# Patient Record
Sex: Female | Born: 1990 | Race: Black or African American | Hispanic: No | Marital: Single | Smoking: Former smoker
Health system: Southern US, Community
[De-identification: ages and names within clinical notes are randomized; demographics above are authoritative.]

## PROBLEM LIST (undated history)

## (undated) DIAGNOSIS — O139 Gestational [pregnancy-induced] hypertension without significant proteinuria, unspecified trimester: Secondary | ICD-10-CM

## (undated) DIAGNOSIS — R8271 Bacteriuria: Secondary | ICD-10-CM

## (undated) DIAGNOSIS — Z789 Other specified health status: Secondary | ICD-10-CM

## (undated) DIAGNOSIS — I10 Essential (primary) hypertension: Secondary | ICD-10-CM

## (undated) HISTORY — DX: Other specified health status: Z78.9

## (undated) HISTORY — PX: NO PAST SURGERIES: SHX2092

## (undated) HISTORY — DX: Bacteriuria: R82.71

---

## 2017-06-10 NOTE — L&D Delivery Note (Signed)
Delivery Note  First Stage: Labor onset: 0237 Augmentation: Cytotec Analgesia /Anesthesia intrapartum: epidural SROM at 0235  Second Stage: Complete dilation at 0842 Onset of pushing at 0850 FHR second stage 150  Delivery of a viable female at 68 by CNM in OA position Loose nuchal cord x 1 Cord double clamped after cessation of pulsation, cut by CNM Cord blood sample collected   Arterial cord blood sample collected = 7.285  Third Stage: Placenta delivered via Tomasa Blase intact with 3 VC @ 0900 Placenta disposition: pathology Uterine tone firm / bleeding small  1st degree vaginal laceration identified // 4 cm x 8 cm Bartholin's abscess on RT side; unable to I&D -- will start on Bactrim DS 1 tab BID x 7 days Anesthesia for repair: epidural Repair 2-0 vicryl rapide Est. Blood Loss (mL): 150  Complications: NICU team called at delivery, neonate attempting to take breaths, no respirations noted, NRP steps taken by Colin Mulders, RN and Davina Poke, RN -- care of neonate taken over upon arrival to the room; MGM and FOB at bassinet side // mother thrashing, crying and vomiting during collection of cord gases, only 0.5 mL collected to avoid needle stick of CNM and patient  Mom to postpartum.  Baby to NICU.  Newborn: Birth Weight: 5 lbs 10.7 oz  Apgar Scores: 1/4/7 per NICU team Feeding planned: breast & formula  Raelyn Mora  MSN, CNM 10/31/2017, 10:20 AM

## 2017-10-16 ENCOUNTER — Other Ambulatory Visit: Payer: Self-pay

## 2017-10-16 ENCOUNTER — Encounter (HOSPITAL_COMMUNITY): Payer: Self-pay | Admitting: *Deleted

## 2017-10-16 ENCOUNTER — Inpatient Hospital Stay (HOSPITAL_COMMUNITY)
Admission: AD | Admit: 2017-10-16 | Discharge: 2017-10-16 | Disposition: A | Discharge status: Home / Self Care | Payer: Medicaid Other | Source: Ambulatory Visit | Attending: Obstetrics & Gynecology | Admitting: Obstetrics & Gynecology

## 2017-10-16 ENCOUNTER — Telehealth: Payer: Self-pay | Admitting: Advanced Practice Midwife

## 2017-10-16 DIAGNOSIS — R109 Unspecified abdominal pain: Secondary | ICD-10-CM | POA: Diagnosis not present

## 2017-10-16 DIAGNOSIS — O0933 Supervision of pregnancy with insufficient antenatal care, third trimester: Secondary | ICD-10-CM

## 2017-10-16 DIAGNOSIS — Z3A37 37 weeks gestation of pregnancy: Secondary | ICD-10-CM | POA: Diagnosis not present

## 2017-10-16 DIAGNOSIS — Z880 Allergy status to penicillin: Secondary | ICD-10-CM | POA: Diagnosis not present

## 2017-10-16 DIAGNOSIS — F17210 Nicotine dependence, cigarettes, uncomplicated: Secondary | ICD-10-CM | POA: Insufficient documentation

## 2017-10-16 DIAGNOSIS — O99333 Smoking (tobacco) complicating pregnancy, third trimester: Secondary | ICD-10-CM | POA: Insufficient documentation

## 2017-10-16 DIAGNOSIS — O26893 Other specified pregnancy related conditions, third trimester: Secondary | ICD-10-CM | POA: Insufficient documentation

## 2017-10-16 DIAGNOSIS — O26843 Uterine size-date discrepancy, third trimester: Secondary | ICD-10-CM

## 2017-10-16 DIAGNOSIS — Z3A00 Weeks of gestation of pregnancy not specified: Secondary | ICD-10-CM | POA: Diagnosis not present

## 2017-10-16 LAB — DIFFERENTIAL
BASOS PCT: 0 %
Basophils Absolute: 0 10*3/uL (ref 0.0–0.1)
EOS ABS: 0.1 10*3/uL (ref 0.0–0.7)
EOS PCT: 2 %
Lymphocytes Relative: 28 %
Lymphs Abs: 2.2 10*3/uL (ref 0.7–4.0)
MONO ABS: 0.4 10*3/uL (ref 0.1–1.0)
Monocytes Relative: 5 %
Neutro Abs: 5.1 10*3/uL (ref 1.7–7.7)
Neutrophils Relative %: 65 %

## 2017-10-16 LAB — TYPE AND SCREEN
ABO/RH(D): O POS
Antibody Screen: NEGATIVE

## 2017-10-16 LAB — COMPREHENSIVE METABOLIC PANEL
ALK PHOS: 881 U/L — AB (ref 38–126)
ALT: 14 U/L (ref 14–54)
ANION GAP: 12 (ref 5–15)
AST: 20 U/L (ref 15–41)
Albumin: 2.8 g/dL — ABNORMAL LOW (ref 3.5–5.0)
BILIRUBIN TOTAL: 0.6 mg/dL (ref 0.3–1.2)
BUN: 8 mg/dL (ref 6–20)
CALCIUM: 8.5 mg/dL — AB (ref 8.9–10.3)
CO2: 22 mmol/L (ref 22–32)
Chloride: 101 mmol/L (ref 101–111)
Creatinine, Ser: 0.63 mg/dL (ref 0.44–1.00)
GFR calc non Af Amer: >60 mL/min (ref >60)
Glucose, Bld: 109 mg/dL — ABNORMAL HIGH (ref 65–99)
Potassium: 3.4 mmol/L — ABNORMAL LOW (ref 3.5–5.1)
SODIUM: 135 mmol/L (ref 135–145)
TOTAL PROTEIN: 6.5 g/dL (ref 6.5–8.1)

## 2017-10-16 LAB — CBC
HCT: 35.6 % — ABNORMAL LOW (ref 36.0–46.0)
HEMOGLOBIN: 11.9 g/dL — AB (ref 12.0–15.0)
MCH: 28.7 pg (ref 26.0–34.0)
MCHC: 33.4 g/dL (ref 30.0–36.0)
MCV: 86 fL (ref 78.0–100.0)
Platelets: 259 10*3/uL (ref 150–400)
RBC: 4.14 MIL/uL (ref 3.87–5.11)
RDW: 12.8 % (ref 11.5–15.5)
WBC: 7.8 10*3/uL (ref 4.0–10.5)

## 2017-10-16 LAB — RAPID URINE DRUG SCREEN, HOSP PERFORMED
AMPHETAMINES: NOT DETECTED
BARBITURATES: NOT DETECTED
BENZODIAZEPINES: NOT DETECTED
Cocaine: NOT DETECTED
Opiates: NOT DETECTED
Tetrahydrocannabinol: POSITIVE — AB

## 2017-10-16 LAB — ABO/RH: ABO/RH(D): O POS

## 2017-10-16 LAB — URINALYSIS, ROUTINE W REFLEX MICROSCOPIC
Bilirubin Urine: NEGATIVE
GLUCOSE, UA: NEGATIVE mg/dL
KETONES UR: 20 mg/dL — AB
NITRITE: POSITIVE — AB
PH: 7 (ref 5.0–8.0)
Protein, ur: 30 mg/dL — AB
SPECIFIC GRAVITY, URINE: 1.008 (ref 1.005–1.030)

## 2017-10-16 LAB — OB RESULTS CONSOLE GBS: STREP GROUP B AG: NEGATIVE

## 2017-10-16 LAB — OB RESULTS CONSOLE GC/CHLAMYDIA: GC PROBE AMP, GENITAL: NEGATIVE

## 2017-10-16 LAB — PROTEIN / CREATININE RATIO, URINE
CREATININE, URINE: 114 mg/dL
PROTEIN CREATININE RATIO: 0.36 mg/mg{creat} — AB (ref 0.00–0.15)
TOTAL PROTEIN, URINE: 41 mg/dL

## 2017-10-16 LAB — HEPATITIS B SURFACE ANTIGEN: Hepatitis B Surface Ag: NEGATIVE

## 2017-10-16 MED ORDER — NITROFURANTOIN MONOHYD MACRO 100 MG PO CAPS: 100.0000 mg | ORAL_CAPSULE | Freq: Two times a day (BID) | ORAL | 0 refills | Status: DC

## 2017-10-16 NOTE — Telephone Encounter (Signed)
Reviewed abnormal protein creatinine ratio with Dr Macon Large. Pt still with unsure dates, outpatient [place] scheduled next week.  Plan to recheck BP tomorrow.  Pt new OB scheduled tomorrow at 1:30 pm.  Also, UTI noted in pt urine sample today in MAU. Called pt to notify her of Macrobid 100 mg BID x 7 days (allergy to PCN/cephalosporins) and of prenatal appointment tomorrow.  Pt phone number is not a working number. Called pt emergency contact number ,her mother, and phone rang then busy signal so unable to leave a message.

## 2017-10-16 NOTE — Progress Notes (Signed)
Ambulates off unit with mother at side and discharge papers in hand

## 2017-10-16 NOTE — MAU Note (Signed)
States contractions over the past week. Currently no pain

## 2017-10-16 NOTE — Discharge Instructions (Signed)

## 2017-10-16 NOTE — MAU Provider Note (Addendum)
Chief Complaint:  Abdominal Pain   First Provider Initiated Contact with Patient 10/16/17 1036      HPI: Diane Ramirez is a 27 y.o. G1P0 at 19w1dwho presents to maternity admissions reporting she has not had prenatal care and is worried, and she had cramping off and on this week.  She denies pain today. There is no vaginal bleeding or leaking of fluid.  The baby is moving well. She reports she was initially in denial about the pregnancy, and then just made a mistake to wait so long to start care. She has no previous medical problems.  She reports a sure LMP and regular periods.  There are no other symptoms.  She has not tried any treatments.    HPI  Past Medical History: History reviewed. No pertinent past medical history.  Past obstetric history: OB History  Gravida Para Term Preterm AB Living  1            SAB TAB Ectopic Multiple Live Births               # Outcome Date GA Lbr Len/2nd Weight Sex Delivery Anes PTL Lv  1 Current             Past Surgical History: History reviewed. No pertinent surgical history.  Family History: No family history on file.  Social History: Social History   Tobacco Use  . Smoking status: Current Every Day Smoker    Packs/day: 0.25    Years: 1.00    Pack years: 0.25    Types: Cigars  . Smokeless tobacco: Never Used  Substance Use Topics  . Alcohol use: Not Currently  . Drug use: Never    Allergies:  Allergies  Allergen Reactions  . Penicillins Swelling    Has patient had a PCN reaction causing immediate rash, facial/tongue/throat swelling, SOB or lightheadedness with hypotension: yes Has patient had a PCN reaction causing severe rash involving mucus membranes or skin necrosis: no Has patient had a PCN reaction that required hospitalization:no Has patient had a PCN reaction occurring within the last 10 years: {no If all of the above answers are "NO", then may proceed with Cephalosporin use.     Meds:  No medications prior to  admission.    ROS:  Review of Systems  Constitutional: Negative for chills, fatigue and fever.  Eyes: Negative for visual disturbance.  Respiratory: Negative for shortness of breath.   Cardiovascular: Negative for chest pain.  Gastrointestinal: Negative for abdominal pain, nausea and vomiting.  Genitourinary: Negative for difficulty urinating, dysuria, flank pain, pelvic pain, vaginal bleeding, vaginal discharge and vaginal pain.  Neurological: Negative for dizziness and headaches.  Psychiatric/Behavioral: Negative.      I have reviewed patient's Past Medical Hx, Surgical Hx, Family Hx, Social Hx, medications and allergies.   Physical Exam   Patient Vitals for the past 24 hrs:  BP Temp Temp src Pulse Resp SpO2 Height Weight  10/16/17 1140 130/84 98.6 F (37 C) Oral 87 16 99 % - -  10/16/17 1030 130/83 - - 90 - - - -  10/16/17 1015 120/76 - - (!) 107 - - - -  10/16/17 1000 121/80 - - (!) 101 - - - -  10/16/17 0949 135/72 - - (!) 102 - - - -  10/16/17 0916 121/90 98.7 F (37.1 C) Oral (!) 120 16 100 % 5' 4.5" (1.638 m) 157 lb (71.2 kg)   Constitutional: Well-developed, well-nourished female in no acute distress.  Cardiovascular: normal rate  Respiratory: normal effort GI: Abd soft, non-tender, gravid appropriate for gestational age.  MS: Extremities nontender, no edema, normal ROM Neurologic: Alert and oriented x 4.  GU: Neg CVAT.  PELVIC EXAM: Cervix pink, visually closed, without lesion, scant white creamy discharge, vaginal walls and external genitalia normal Bimanual exam: Cervix 0/long/high, firm, anterior, neg CMT, uterus nontender, nonenlarged, adnexa without tenderness, enlargement, or mass    FHT:  Baseline  125, moderate variability, accelerations present, no decelerations Contractions: None on toco or to palpation   Labs: Results for orders placed or performed during the hospital encounter of 10/16/17 (from the past 24 hour(s))  Urinalysis, Routine w reflex  microscopic     Status: Abnormal   Collection Time: 10/16/17  9:18 AM  Result Value Ref Range   Color, Urine YELLOW YELLOW   APPearance CLOUDY (A) CLEAR   Specific Gravity, Urine 1.008 1.005 - 1.030   pH 7.0 5.0 - 8.0   Glucose, UA NEGATIVE NEGATIVE mg/dL   Hgb urine dipstick SMALL (A) NEGATIVE   Bilirubin Urine NEGATIVE NEGATIVE   Ketones, ur 20 (A) NEGATIVE mg/dL   Protein, ur 30 (A) NEGATIVE mg/dL   Nitrite POSITIVE (A) NEGATIVE   Leukocytes, UA LARGE (A) NEGATIVE   RBC / HPF 0-5 0 - 5 RBC/hpf   WBC, UA >50 (H) 0 - 5 WBC/hpf   Bacteria, UA MANY (A) NONE SEEN   Squamous Epithelial / LPF 0-5 0 - 5   WBC Clumps PRESENT    Mucus PRESENT   Protein / creatinine ratio, urine     Status: Abnormal   Collection Time: 10/16/17  9:18 AM  Result Value Ref Range   Creatinine, Urine 114.00 mg/dL   Total Protein, Urine 41 mg/dL   Protein Creatinine Ratio 0.36 (H) 0.00 - 0.15 mg/mg[Cre]  Urine rapid drug screen (hosp performed)     Status: Abnormal   Collection Time: 10/16/17  9:18 AM  Result Value Ref Range   Opiates NONE DETECTED NONE DETECTED   Cocaine NONE DETECTED NONE DETECTED   Benzodiazepines NONE DETECTED NONE DETECTED   Amphetamines NONE DETECTED NONE DETECTED   Tetrahydrocannabinol POSITIVE (A) NONE DETECTED   Barbiturates NONE DETECTED NONE DETECTED  CBC     Status: Abnormal   Collection Time: 10/16/17  9:53 AM  Result Value Ref Range   WBC 7.8 4.0 - 10.5 K/uL   RBC 4.14 3.87 - 5.11 MIL/uL   Hemoglobin 11.9 (L) 12.0 - 15.0 g/dL   HCT 16.1 (L) 09.6 - 04.5 %   MCV 86.0 78.0 - 100.0 fL   MCH 28.7 26.0 - 34.0 pg   MCHC 33.4 30.0 - 36.0 g/dL   RDW 40.9 81.1 - 91.4 %   Platelets 259 150 - 400 K/uL  Comprehensive metabolic panel     Status: Abnormal   Collection Time: 10/16/17  9:53 AM  Result Value Ref Range   Sodium 135 135 - 145 mmol/L   Potassium 3.4 (L) 3.5 - 5.1 mmol/L   Chloride 101 101 - 111 mmol/L   CO2 22 22 - 32 mmol/L   Glucose, Bld 109 (H) 65 - 99 mg/dL    BUN 8 6 - 20 mg/dL   Creatinine, Ser 7.82 0.44 - 1.00 mg/dL   Calcium 8.5 (L) 8.9 - 10.3 mg/dL   Total Protein 6.5 6.5 - 8.1 g/dL   Albumin 2.8 (L) 3.5 - 5.0 g/dL   AST 20 15 - 41 U/L   ALT 14 14 - 54 U/L  Alkaline Phosphatase 881 (H) 38 - 126 U/L   Total Bilirubin 0.6 0.3 - 1.2 mg/dL   GFR calc non Af Amer >60 >60 mL/min   GFR calc Af Amer >60 >60 mL/min   Anion gap 12 5 - 15  Hepatitis B surface antigen     Status: None   Collection Time: 10/16/17  9:53 AM  Result Value Ref Range   Hepatitis B Surface Ag Negative Negative  Differential     Status: None   Collection Time: 10/16/17  9:53 AM  Result Value Ref Range   Neutrophils Relative % 65 %   Neutro Abs 5.1 1.7 - 7.7 K/uL   Lymphocytes Relative 28 %   Lymphs Abs 2.2 0.7 - 4.0 K/uL   Monocytes Relative 5 %   Monocytes Absolute 0.4 0.1 - 1.0 K/uL   Eosinophils Relative 2 %   Eosinophils Absolute 0.1 0.0 - 0.7 K/uL   Basophils Relative 0 %   Basophils Absolute 0.0 0.0 - 0.1 K/uL  Type and screen     Status: None   Collection Time: 10/16/17  9:53 AM  Result Value Ref Range   ABO/RH(D) O POS    Antibody Screen NEG    Sample Expiration      10/19/2017 Performed at [facility], [address], [place], [place] [address]   ABO/Rh     Status: None   Collection Time: 10/16/17  9:57 AM  Result Value Ref Range   ABO/RH(D)      O POS Performed at [facility], [address] [place], [place] [address]    --/--/O POS Performed at [facility], [address] [place], [place] [address]  (05/09 9147)  Imaging:  Limited OB US Date: 11/16/17 EDD :  11/05/17 based on LMP Viability:  FHT 140 BPD measurement c/w 35 weeks, AC and FL more c/w 30 weeks Fetal position: Vertex noted with cranial features identified in pelvis on today's [place], fluid subjectively normal, fetal movement, including opening and closing fist noted on [place]  MAU Course/MDM: Preeclampsia labs ordered, CBC, CMP wnl Pt with one borderline BP at  121/90, all other BP normal in MAU NST reviewed and reactive Size/date discrepancy with fundal height so bedside US with measurements concerning for IUGR vs incorrect pregnancy dating Consult Dr Macon Large with assessment and findings Pt D/C'd home with outpatient anatomy US ordered with MFM and message sent to [facility] office to establish care Preeclampsia and labor precautions reviewed Pt discharge with strict return precautions.   Assessment: 1. Uterine size date discrepancy pregnancy, third trimester   2. Late prenatal care affecting pregnancy in third trimester     Plan: Discharge home Labor precautions and fetal kick counts Follow-up Information    [facility] Follow [facility].   Specialty:  Maternal and Fetal Medicine Why:  Keep appt for ultrasound on Friday, 10/24/17, at 9:45 am.  Contact information: [address] [id number] mc [place] [address] [phone]       [facility] Follow up.   Specialty:  Obstetrics and Gynecology Why:  The office will call you with appointment. Return to MAU as needed for emergencies. Contact information: [address] [place] [address] [phone]         Allergies as of 10/16/2017      Reactions   Penicillins Swelling   Has patient had a PCN reaction causing immediate rash, facial/tongue/throat swelling, SOB or lightheadedness with hypotension: yes Has patient had  a PCN reaction causing severe rash involving mucus membranes or skin necrosis: no Has patient had a PCN reaction that required hospitalization:no Has patient had a PCN reaction occurring within the last 10 years: {no If all of the above answers are "NO", then may proceed with Cephalosporin use.      Medication List    TAKE these medications   multivitamin-prenatal 27-0.8 MG Tabs tablet Take 1 tablet by mouth daily at 12 noon.       Sharen Counter Certified  Nurse-Midwife 10/16/2017 6:32 PM

## 2017-10-16 NOTE — MAU Note (Signed)
Pt presents with complaint of ? [redacted] weeks pregnant and has not had prenatal care. States she was having cramping the last few days , none today, but became worried because she had not had any care.

## 2017-10-17 ENCOUNTER — Other Ambulatory Visit: Payer: Self-pay | Admitting: Certified Nurse Midwife

## 2017-10-17 ENCOUNTER — Ambulatory Visit (INDEPENDENT_AMBULATORY_CARE_PROVIDER_SITE_OTHER): Payer: Medicaid Other | Admitting: Certified Nurse Midwife

## 2017-10-17 ENCOUNTER — Telehealth: Payer: Self-pay | Admitting: Advanced Practice Midwife

## 2017-10-17 ENCOUNTER — Encounter: Payer: Self-pay | Admitting: Certified Nurse Midwife

## 2017-10-17 ENCOUNTER — Encounter: Payer: Self-pay | Admitting: Advanced Practice Midwife

## 2017-10-17 DIAGNOSIS — Z3400 Encounter for supervision of normal first pregnancy, unspecified trimester: Secondary | ICD-10-CM | POA: Insufficient documentation

## 2017-10-17 DIAGNOSIS — Z23 Encounter for immunization: Secondary | ICD-10-CM

## 2017-10-17 DIAGNOSIS — O0930 Supervision of pregnancy with insufficient antenatal care, unspecified trimester: Secondary | ICD-10-CM | POA: Insufficient documentation

## 2017-10-17 DIAGNOSIS — O26843 Uterine size-date discrepancy, third trimester: Secondary | ICD-10-CM | POA: Insufficient documentation

## 2017-10-17 DIAGNOSIS — Z3403 Encounter for supervision of normal first pregnancy, third trimester: Secondary | ICD-10-CM

## 2017-10-17 LAB — GC/CHLAMYDIA PROBE AMP (~~LOC~~) NOT AT ARMC
Chlamydia: NEGATIVE
Neisseria Gonorrhea: NEGATIVE

## 2017-10-17 LAB — RPR: RPR: NONREACTIVE

## 2017-10-17 LAB — RUBELLA SCREEN: RUBELLA: 1.08 {index} (ref >0.99)

## 2017-10-17 LAB — HIV ANTIBODY (ROUTINE TESTING W REFLEX): HIV SCREEN 4TH GENERATION: NONREACTIVE

## 2017-10-17 NOTE — Patient Instructions (Addendum)
ARE[facility]ILY [place] [address], [address] [place], [place]  [address] Phone - [phone]   Fax - [phone]  [facility] [place] [address] [place], [place] [address] Phone - [phone]   Fax - [place] [address] [place], [place]  [address] Phone - [phone]   Fax - [phone]  [place]. [address] [place] [address] [place], [place]  [address] Phone - [phone]   Fax - [phone]  [place] [address] [place], [place]  [address] Phone - [phone]   Fax - [phone]  [facility] [address] [place], [place]  [address] Phone - [phone]   Fax - [place] [address] [place], [place]  [address] Phone - [phone]   Fax - [phone]  [place] [address] [place], [place] [address] [place], [place]  [address] Phone - [phone]   Fax - [place] [address] [place], [place]  [address] Phone - [phone]   Fax - [phone] [facility] [place] [place]. [address] [place], [place]  [address] Phone - [phone]   Fax - [phone]  [place] [address] [place], [place]  [address] Phone - [phone]   Fax - [phone]  [facility] [place], [place], [place]  [address] Phone - [phone]   Fax - [place] [address] [place], [place]  [address] Phone - [phone]   Fax - [phone]  [facility] [address] [place], [place]  [address] Phone - [place], [place]  [address] Phone - [phone]   Fax - [place] [address] [place], [place]  [address] Phone - [phone]   Fax - [phone]  [place] [address] [place], [place]  [address] Phone - [phone]   Fax - [place]. [place], [place]  [address] Phone - [phone]   Fax - [place], [place], [place]  [address] Phone - [phone]   Fax - [place] [address], [place], [place]  [address] Phone - [phone]   Fax - [phone]  [place] [address] [place], [place]  [address] Phone - [phone]   Fax - [place] [address] [place], [place]  [address] Phone - [phone]   Fax - [phone]  [place] [address] [place], [place]  [address] Phone - [phone]   Fax - [phone] [place] [address] [place], [place]  [address] Phone - [phone]   Fax - [place] [address] [place], [place]  [address] Phone - [phone]   Fax - [place] [address] [place], [place]  [address] Phone - [phone]   Fax - [phone]  [place] [address] [place] [address] Phone [phone]  Fax [phone]  Places to have your son circumcised:    [facility] [phone] while you  are in hospital  [facility] [phone] $244 by 4 wks  [facility] [phone] $175 by 2 wks  [facility] [phone] $250 by 7 days [facility] [phone] $269 by 4 wks  These prices sometimes change but are roughly what you can expect to pay. Please  call and confirm pricing.   Circumcision is considered an elective/non-medically necessary procedure. There are many reasons parents decide to have their sons circumsized. During the first year of life circumcised males have a reduced risk of urinary tract infections but after this year the rates between circumcised males and uncircumcised males are the same.  It is safe to have your son circumcised outside of the hospital and the places above perform them regularly.   Deciding about Circumcision in Baby Boys  (Up-to-date The Basics)  What is circumcision?  Circumcision is a surgery that removes the skin that covers the tip of the penis, called the "foreskin" Circumcision is usually done when a boy is between 40 and 45 days old. In the [place], circumcision is common. In some other countries, fewer boys are circumcised. Circumcision is a common tradition in some religions.  Should I have my baby boy circumcised?  There is no easy answer. Circumcision has some benefits. But it also has risks. After talking with your doctor, you will have to decide for yourself what is right for your family.  What are the benefits of circumcision?  Circumcised boys seem to have slightly lower rates of: ?Urinary tract infections ?Swelling of the opening at the tip of the penis Circumcised men seem to have slightly lower rates of: ?Urinary tract infections ?Swelling of the opening at the tip of the penis ?Penis cancer ?HIV and other infections that you catch during sex ?Cervical cancer in the women they have sex with Even so, in the [place], the risks of these problems are small - even in boys and men who have not been circumcised. Plus, boys and men who are not circumcised can reduce these extra risks by: ?Cleaning their penis well ?Using condoms during sex  What are the risks of circumcision?  Risks include: ?Bleeding or infection from the surgery ?Damage to or amputation of the  penis ?A chance that the doctor will cut off too much or not enough of the foreskin ?A chance that sex won't feel as good later in life Only about 1 out of every 200 circumcisions leads to problems. There is also a chance that your health insurance won't pay for circumcision.  How is circumcision done in baby boys?  First, the baby gets medicine for pain relief. This might be a cream on the skin or a shot into the base of the penis. Next, the doctor cleans the baby's penis well. Then he or she uses special tools to cut off the foreskin. Finally, the doctor wraps a bandage (called gauze) around the baby's penis. If you have your baby circumcised, his doctor or nurse will give you instructions on how to care for him after the surgery. It is important that you follow those instructions carefully.   Preeclampsia and Eclampsia Preeclampsia is a serious condition that develops only during pregnancy. It is also called toxemia of pregnancy. This condition causes high blood pressure along with other symptoms, such as swelling and headaches. These symptoms may develop as the condition gets worse. Preeclampsia may occur at 20 weeks of pregnancy or later. Diagnosing and treating preeclampsia early is very important. If not treated early, it can cause serious problems for  you and your baby. One problem it can lead to is eclampsia, which is a condition that causes muscle jerking or shaking (convulsions or seizures) in the mother. Delivering your baby is the best treatment for preeclampsia or eclampsia. Preeclampsia and eclampsia symptoms usually go away after your baby is born. What are the causes? The cause of preeclampsia is not known. What increases the risk? The following risk factors make you more likely to develop preeclampsia:  Being pregnant for the first time.  Having had preeclampsia during a past pregnancy.  Having a family history of preeclampsia.  Having high blood pressure.  Being pregnant  with twins or triplets.  Being 42 or older.  Being African-American.  Having kidney disease or diabetes.  Having medical conditions such as lupus or blood diseases.  Being very overweight (obese).  What are the signs or symptoms? The earliest signs of preeclampsia are:  High blood pressure.  Increased protein in your urine. Your health care provider will check for this at every visit before you give birth (prenatal visit).  Other symptoms that may develop as the condition gets worse include:  Severe headaches.  Sudden weight gain.  Swelling of the hands, face, legs, and feet.  Nausea and vomiting.  Vision problems, such as blurred or double vision.  Numbness in the face, arms, legs, and feet.  Urinating less than usual.  Dizziness.  Slurred speech.  Abdominal pain, especially upper abdominal pain.  Convulsions or seizures.  Symptoms generally go away after giving birth. How is this diagnosed? There are no screening tests for preeclampsia. Your health care provider will ask you about symptoms and check for signs of preeclampsia during your prenatal visits. You may also have tests that include:  Urine tests.  Blood tests.  Checking your blood pressure.  Monitoring your baby's heart rate.  Ultrasound.  How is this treated? You and your health care provider will determine the treatment approach that is best for you. Treatment may include:  Having more frequent prenatal exams to check for signs of preeclampsia, if you have an increased risk for preeclampsia.  Bed rest.  Reducing how much salt (sodium) you eat.  Medicine to lower your blood pressure.  Staying in the hospital, if your condition is severe. There, treatment will focus on controlling your blood pressure and the amount of fluids in your body (fluid retention).  You may need to take medicine (magnesium sulfate) to prevent seizures. This medicine may be given as an injection or through an IV  tube.  Delivering your baby early, if your condition gets worse. You may have your labor started with medicine (induced), or you may have a cesarean delivery.  Follow these instructions at home: Eating and drinking   Drink enough fluid to keep your urine clear or pale yellow.  Eat a healthy diet that is low in sodium. Do not add salt to your food. Check nutrition labels to see how much sodium a food or beverage contains.  Avoid caffeine. Lifestyle  Do not use any products that contain nicotine or tobacco, such as cigarettes and e-cigarettes. If you need help quitting, ask your health care provider.  Do not use alcohol or drugs.  Avoid stress as much as possible. Rest and get plenty of sleep. General instructions  Take over-the-counter and prescription medicines only as told by your health care provider.  When lying down, lie on your side. This keeps pressure off of your baby.  When sitting or lying down, raise (elevate)  your feet. Try putting some pillows underneath your lower legs.  Exercise regularly. Ask your health care provider what kinds of exercise are best for you.  Keep all follow-up and prenatal visits as told by your health care provider. This is important. How is this prevented? To prevent preeclampsia or eclampsia from developing during another pregnancy:  Get proper medical care during pregnancy. Your health care provider may be able to prevent preeclampsia or diagnose and treat it early.  Your health care provider may have you take a low-dose aspirin or a calcium supplement during your next pregnancy.  You may have tests of your blood pressure and kidney function after giving birth.  Maintain a healthy weight. Ask your health care provider for help managing weight gain during pregnancy.  Work with your health care provider to manage any long-term (chronic) health conditions you have, such as diabetes or kidney problems.  Contact a health care provider  if:  You gain more weight than expected.  You have headaches.  You have nausea or vomiting.  You have abdominal pain.  You feel dizzy or light-headed. Get help right away if:  You develop sudden or severe swelling anywhere in your body. This usually happens in the legs.  You gain 5 lbs (2.3 kg) or more during one week.  You have severe: ? Abdominal pain. ? Headaches. ? Dizziness. ? Vision problems. ? Confusion. ? Nausea or vomiting.  You have a seizure.  You have trouble moving any part of your body.  You develop numbness in any part of your body.  You have trouble speaking.  You have any abnormal bleeding.  You pass out. This information is not intended to replace advice given to you by your health care provider. Make sure you discuss any questions you have with your health care provider. Document Released: 05/24/2000 Document Revised: 01/23/2016 Document Reviewed: 01/01/2016 Elsevier Interactive Patient Education  [employer].

## 2017-10-17 NOTE — Progress Notes (Signed)
Subjective:   Diane Ramirez is a 27 y.o. G1P0000 at [redacted]w[redacted]d by LMP being seen today for her first obstetrical visit.  Her obstetrical history is significant for late to prenatal care  weeks gestation. Patient does not intend to breast feed. Pregnancy history fully reviewed.  Patient reports no complaints.  HISTORY: OB History  Gravida Para Term Preterm AB Living  1 0 0 0 0 0  SAB TAB Ectopic Multiple Live Births  0 0 0 0 0    # Outcome Date GA Lbr Len/2nd Weight Sex Delivery Anes PTL Lv  1 Current             Patient is unsure when last Pap smear was but reports that it was normal.   Past Medical History:  Diagnosis Date  . Medical history non-contributory    Past Surgical History:  Procedure Laterality Date  . NO PAST SURGERIES     Family History  Problem Relation Age of Onset  . Cancer Maternal Grandmother   . Cancer Paternal Grandmother    Social History   Tobacco Use  . Smoking status: Current Every Day Smoker    Packs/day: 0.25    Years: 1.00    Pack years: 0.25    Types: Cigars  . Smokeless tobacco: Never Used  Substance Use Topics  . Alcohol use: Not Currently  . Drug use: Never   Allergies  Allergen Reactions  . Penicillins Swelling    Has patient had a PCN reaction causing immediate rash, facial/tongue/throat swelling, SOB or lightheadedness with hypotension: yes Has patient had a PCN reaction causing severe rash involving mucus membranes or skin necrosis: no Has patient had a PCN reaction that required hospitalization:no Has patient had a PCN reaction occurring within the last 10 years: {no If all of the above answers are "NO", then may proceed with Cephalosporin use.    Current Outpatient Medications on File Prior to Visit  Medication Sig Dispense Refill  . Prenatal Vit-Fe Fumarate-FA (MULTIVITAMIN-PRENATAL) 27-0.8 MG TABS tablet Take 1 tablet by mouth daily at 12 noon.    . nitrofurantoin, macrocrystal-monohydrate, (MACROBID) 100 MG capsule  Take 1 capsule (100 mg total) by mouth 2 (two) times daily. (Patient not taking: Reported on 10/17/2017) 14 capsule 0   No current facility-administered medications on file prior to visit.     Review of Systems Pertinent items noted in HPI and remainder of comprehensive ROS otherwise negative.  Exam   Vitals:   10/17/17 1333  BP: 121/88  Pulse: 97  Weight: 160 lb 6.4 oz (72.8 kg)   Fetal Heart Rate (bpm): 128  Uterus:  Fundal Height: 34 cm  System: General: well-developed, well-nourished female in no acute distress   Breast:  normal appearance, no masses or tenderness   Skin: normal coloration and turgor, no rashes   Neurologic: oriented, normal, negative, normal mood   Extremities: normal strength, tone, and muscle mass, ROM of all joints is normal   HEENT PERRLA, extraocular movement intact and sclera clear.   Mouth/Teeth mucous membranes moist, pharynx normal without lesions and dental hygiene good   Neck supple and no masses   Cardiovascular: regular rate and rhythm   Respiratory:  no respiratory distress, normal breath sounds   Abdomen: soft, non-tender; bowel sounds normal; gravid small for gestational age- unsure of correct dating.      Assessment:   Pregnancy: G1P0000 Patient Active Problem List   Diagnosis Date Noted  . Supervision of normal first pregnancy, antepartum  10/17/2017  . Late prenatal care 10/17/2017  . Uterine size date discrepancy pregnancy, third trimester 10/17/2017     Plan:  1. Supervision of normal first pregnancy, antepartum -Prenatal labs drawn in MAU yesterday, PCR elevated to 0.36. Patient denies HA, vision changes or abdominal pain. BP stable today at 121/88. Monitor closely for onset of Severe preeclampsia.  - Hemoglobinopathy Evaluation - SMN1 COPY NUMBER ANALYSIS (SMA Carrier Screen) - Cystic fibrosis gene test - Tdap vaccine greater than or equal to 7yo IM - Glucose tolerance, 1 hour   Continue prenatal vitamins. Ultrasound  discussed; fetal anatomic survey: ordered. Scheduled for 10/24/2017. Problem list reviewed and updated. The nature of [facility] with multiple MDs and other Advanced Practice Providers was explained to patient; also emphasized that residents, students are part of our team. Routine obstetric precautions reviewed. Return in about 1 week (around 10/24/2017) for ROB.  Sharyon Cable, CNM 10/17/17, 3:18 PM

## 2017-10-17 NOTE — Telephone Encounter (Signed)
Called pt again today to notify her of elevated P/C ratio and need for BP check today. Notified pt of New OB appt today at 1:35. Pt will be able to make her appt in [facility] today.  If BP elevated, consider work in to [place] sooner to confirm dates to make delivery decisions.

## 2017-10-18 LAB — HEMOGLOBIN A1C
Est. average glucose Bld gHb Est-mCnc: 111 mg/dL
Hgb A1c MFr Bld: 5.5 % (ref 4.8–5.6)

## 2017-10-18 LAB — CULTURE, BETA STREP (GROUP B ONLY)

## 2017-10-18 LAB — GLUCOSE TOLERANCE, 1 HOUR: Glucose, 1Hr PP: 66 mg/dL (ref 65–199)

## 2017-10-24 ENCOUNTER — Ambulatory Visit (INDEPENDENT_AMBULATORY_CARE_PROVIDER_SITE_OTHER): Payer: Medicaid Other | Admitting: Family Medicine

## 2017-10-24 ENCOUNTER — Ambulatory Visit (HOSPITAL_COMMUNITY): Admit: 2017-10-24 | Payer: Medicaid Other

## 2017-10-24 VITALS — BP 114/82 | HR 88 | Wt 164.8 lb

## 2017-10-24 DIAGNOSIS — O26843 Uterine size-date discrepancy, third trimester: Secondary | ICD-10-CM | POA: Diagnosis not present

## 2017-10-24 DIAGNOSIS — O0930 Supervision of pregnancy with insufficient antenatal care, unspecified trimester: Secondary | ICD-10-CM

## 2017-10-24 DIAGNOSIS — O0933 Supervision of pregnancy with insufficient antenatal care, third trimester: Secondary | ICD-10-CM | POA: Diagnosis present

## 2017-10-24 DIAGNOSIS — Z3400 Encounter for supervision of normal first pregnancy, unspecified trimester: Secondary | ICD-10-CM

## 2017-10-24 DIAGNOSIS — Z029 Encounter for administrative examinations, unspecified: Secondary | ICD-10-CM

## 2017-10-24 NOTE — Progress Notes (Signed)
   PRENATAL VISIT NOTE  Subjective:  Taiz Bickle is a 27 y.o. G1P0000 at [redacted]w[redacted]d being seen today for ongoing prenatal care.  She is currently monitored for the following issues for this low-risk pregnancy and has Supervision of normal first pregnancy, antepartum; Late prenatal care; and Uterine size date discrepancy pregnancy, third trimester on their problem list.  Patient reports no complaints, other than she missed appt today, as she was late (thought appt time here was her U/S appt).  Contractions: Not present. Vag. Bleeding: None.  Movement: Present. Denies leaking of fluid.   The following portions of the patient's history were reviewed and updated as appropriate: allergies, current medications, past family history, past medical history, past social history, past surgical history and problem list. Problem list updated.  Objective:   Vitals:   10/24/17 1130  BP: 114/82  Pulse: 88  Weight: 164 lb 12.8 oz (74.8 kg)    Fetal Status: Fetal Heart Rate (bpm): 154   Movement: Present     General:  Alert, oriented and cooperative. Patient is in no acute distress.  Skin: Skin is warm and dry. No rash noted.   Cardiovascular: Normal heart rate noted  Respiratory: Normal respiratory effort, no problems with respiration noted  Abdomen: Soft, gravid, appropriate for gestational age.  Pain/Pressure: Absent     Pelvic: Cervical exam deferred        Extremities: Normal range of motion.  Edema: None  Mental Status: Normal mood and affect. Normal behavior. Normal judgment and thought content.   Assessment and Plan:  Pregnancy: G1P0000 at [redacted]w[redacted]d  1. Supervision of normal first pregnancy, antepartum EDD based on LMP, Size < dates. Pt missed U/S appt today (she was late), and re-scheduled for next week. Emphasized importance of being on time for appt next week   2. Late prenatal care Care started at 37 weeks - SW consult on admission   3. Size < dates - U/S scheduled as above  Term labor  symptoms and general obstetric precautions including but not limited to vaginal bleeding, contractions, leaking of fluid and fetal movement were reviewed in detail with the patient. Please refer to After Visit Summary for other counseling recommendations.  No follow-ups on file.  Future Appointments  Date Time Provider Department Center  10/30/2017 12:45 PM [facility] [place] 5 [facility]-M[facility] M[facility]-US  10/31/2017  2:15 PM Pincus Large, DO [facility]    Frederik Pear, MD

## 2017-10-25 LAB — HEMOGLOBINOPATHY EVALUATION
Ferritin: 119 ng/mL (ref 15–150)
Hematocrit: 35.1 % (ref 34.0–46.6)
Hemoglobin: 11.8 g/dL (ref 11.1–15.9)
Hgb A2 Quant: 2.1 % (ref 1.8–3.2)
Hgb A: 97.9 % (ref 96.4–98.8)
Hgb C: 0 %
Hgb F Quant: 0 % (ref 0.0–2.0)
Hgb S: 0 %
Hgb Solubility: NEGATIVE
Hgb Variant: 0 %
MCH: 28.4 pg (ref 26.6–33.0)
MCHC: 33.6 g/dL (ref 31.5–35.7)
MCV: 85 fL (ref 79–97)
Platelets: 306 10*3/uL (ref 150–379)
RBC: 4.15 x10E6/uL (ref 3.77–5.28)
RDW: 13.5 % (ref 12.3–15.4)
WBC: 8.1 10*3/uL (ref 3.4–10.8)

## 2017-10-25 LAB — SMN1 COPY NUMBER ANALYSIS (SMA CARRIER SCREENING)

## 2017-10-25 LAB — CYSTIC FIBROSIS GENE TEST

## 2017-10-29 ENCOUNTER — Encounter (HOSPITAL_COMMUNITY): Payer: Self-pay

## 2017-10-30 ENCOUNTER — Ambulatory Visit (HOSPITAL_COMMUNITY)
Admission: RE | Admit: 2017-10-30 | Discharge: 2017-10-30 | Disposition: A | Discharge status: Home / Self Care | Payer: Medicaid Other | Source: Ambulatory Visit | Attending: Advanced Practice Midwife | Admitting: Advanced Practice Midwife

## 2017-10-30 ENCOUNTER — Other Ambulatory Visit (HOSPITAL_COMMUNITY): Payer: Self-pay | Admitting: Advanced Practice Midwife

## 2017-10-30 ENCOUNTER — Encounter (HOSPITAL_COMMUNITY): Payer: Self-pay

## 2017-10-30 ENCOUNTER — Encounter (HOSPITAL_COMMUNITY): Payer: Self-pay | Admitting: *Deleted

## 2017-10-30 ENCOUNTER — Inpatient Hospital Stay (HOSPITAL_COMMUNITY)
Admission: AD | Admit: 2017-10-30 | Discharge: 2017-11-02 | DRG: 805 | Disposition: A | Discharge status: Home / Self Care | Payer: Medicaid Other | Source: Ambulatory Visit | Attending: Obstetrics & Gynecology | Admitting: Obstetrics & Gynecology

## 2017-10-30 ENCOUNTER — Other Ambulatory Visit: Payer: Self-pay

## 2017-10-30 DIAGNOSIS — Z363 Encounter for antenatal screening for malformations: Secondary | ICD-10-CM

## 2017-10-30 DIAGNOSIS — O0933 Supervision of pregnancy with insufficient antenatal care, third trimester: Secondary | ICD-10-CM

## 2017-10-30 DIAGNOSIS — O365930 Maternal care for other known or suspected poor fetal growth, third trimester, not applicable or unspecified: Secondary | ICD-10-CM | POA: Diagnosis present

## 2017-10-30 DIAGNOSIS — Z880 Allergy status to penicillin: Secondary | ICD-10-CM | POA: Diagnosis not present

## 2017-10-30 DIAGNOSIS — Z3400 Encounter for supervision of normal first pregnancy, unspecified trimester: Secondary | ICD-10-CM

## 2017-10-30 DIAGNOSIS — O26843 Uterine size-date discrepancy, third trimester: Secondary | ICD-10-CM

## 2017-10-30 DIAGNOSIS — Z87891 Personal history of nicotine dependence: Secondary | ICD-10-CM

## 2017-10-30 DIAGNOSIS — Z3A40 40 weeks gestation of pregnancy: Secondary | ICD-10-CM | POA: Diagnosis not present

## 2017-10-30 DIAGNOSIS — O0930 Supervision of pregnancy with insufficient antenatal care, unspecified trimester: Secondary | ICD-10-CM

## 2017-10-30 DIAGNOSIS — N750 Cyst of Bartholin's gland: Secondary | ICD-10-CM | POA: Diagnosis not present

## 2017-10-30 LAB — CBC
HCT: 36.8 % (ref 36.0–46.0)
Hemoglobin: 12.3 g/dL (ref 12.0–15.0)
MCH: 28.6 pg (ref 26.0–34.0)
MCHC: 33.4 g/dL (ref 30.0–36.0)
MCV: 85.6 fL (ref 78.0–100.0)
PLATELETS: 256 10*3/uL (ref 150–400)
RBC: 4.3 MIL/uL (ref 3.87–5.11)
RDW: 13.1 % (ref 11.5–15.5)
WBC: 9.8 10*3/uL (ref 4.0–10.5)

## 2017-10-30 LAB — TYPE AND SCREEN
ABO/RH(D): O POS
ANTIBODY SCREEN: NEGATIVE

## 2017-10-30 MED ORDER — LACTATED RINGERS IV SOLN
500.0000 mL | INTRAVENOUS | Status: DC | PRN
  Administered 2017-10-31: 500 mL via INTRAVENOUS

## 2017-10-30 MED ORDER — OXYTOCIN BOLUS FROM INFUSION
500.0000 mL | Freq: Once | INTRAVENOUS | Status: AC | PRN
  Administered 2017-10-31: 500 mL via INTRAVENOUS

## 2017-10-30 MED ORDER — LIDOCAINE HCL (PF) 1 % IJ SOLN
30.0000 mL | INTRAMUSCULAR | Status: DC | PRN
  Filled 2017-10-30: qty 30

## 2017-10-30 MED ORDER — ONDANSETRON HCL 4 MG/2ML IJ SOLN
4.0000 mg | Freq: Four times a day (QID) | INTRAMUSCULAR | Status: DC | PRN
  Administered 2017-10-30 – 2017-10-31 (×2): 4 mg via INTRAVENOUS
  Filled 2017-10-30 (×2): qty 2

## 2017-10-30 MED ORDER — ACETAMINOPHEN 325 MG PO TABS
650.0000 mg | ORAL_TABLET | ORAL | Status: DC | PRN
  Administered 2017-10-31: 650 mg via ORAL
  Filled 2017-10-30: qty 2

## 2017-10-30 MED ORDER — OXYCODONE-ACETAMINOPHEN 5-325 MG PO TABS: 2.0000 | ORAL_TABLET | ORAL | Status: DC | PRN

## 2017-10-30 MED ORDER — FENTANYL CITRATE (PF) 100 MCG/2ML IJ SOLN
100.0000 ug | INTRAMUSCULAR | Status: DC | PRN
  Administered 2017-10-30 – 2017-10-31 (×3): 100 ug via INTRAVENOUS
  Filled 2017-10-30 (×3): qty 2

## 2017-10-30 MED ORDER — TERBUTALINE SULFATE 1 MG/ML IJ SOLN
0.2500 mg | Freq: Once | INTRAMUSCULAR | Status: AC | PRN
  Administered 2017-10-31: 0.25 mg via SUBCUTANEOUS
  Filled 2017-10-30 (×2): qty 1

## 2017-10-30 MED ORDER — OXYTOCIN 40 UNITS IN LACTATED RINGERS INFUSION - SIMPLE MED
2.5000 [IU]/h | Freq: Once | INTRAVENOUS | Status: DC | PRN
  Filled 2017-10-30: qty 1000

## 2017-10-30 MED ORDER — FENTANYL CITRATE (PF) 100 MCG/2ML IJ SOLN
100.0000 ug | Freq: Once | INTRAMUSCULAR | Status: AC
  Administered 2017-10-30: 100 ug via INTRAVENOUS
  Filled 2017-10-30: qty 2

## 2017-10-30 MED ORDER — FENTANYL CITRATE (PF) 100 MCG/2ML IJ SOLN
INTRAMUSCULAR | Status: AC
  Administered 2017-10-30: 100 ug
  Filled 2017-10-30: qty 2

## 2017-10-30 MED ORDER — SOD CITRATE-CITRIC ACID 500-334 MG/5ML PO SOLN: 30.0000 mL | ORAL | Status: DC | PRN

## 2017-10-30 MED ORDER — LACTATED RINGERS IV SOLN
INTRAVENOUS | Status: DC
  Administered 2017-10-30 – 2017-10-31 (×3): via INTRAVENOUS

## 2017-10-30 MED ORDER — MISOPROSTOL 25 MCG QUARTER TABLET
25.0000 ug | ORAL_TABLET | ORAL | Status: DC | PRN
  Administered 2017-10-30: 25 ug via VAGINAL
  Filled 2017-10-30 (×2): qty 1

## 2017-10-30 MED ORDER — OXYCODONE-ACETAMINOPHEN 5-325 MG PO TABS
1.0000 | ORAL_TABLET | ORAL | Status: DC | PRN
Start: 2017-10-30 — End: 2017-10-31

## 2017-10-30 NOTE — H&P (Signed)
LABOR AND DELIVERY ADMISSION HISTORY AND PHYSICAL NOTE  Diane Ramirez is a 27 y.o. female G1P0000 with IUP at [redacted]w[redacted]d by LMP presenting for IOL for IUGR with elevated dopplers.  She reports positive fetal movement. She denies leakage of fluid or vaginal bleeding. She denies abdominal cramping or contractions.   Prenatal History/Complications: PNC at [facility] for 2 visits- late to care  weeks  Pregnancy complications:  - Limited prenatal care  - Late prenatal care - Uterine size less than dates  Nursing Staff Provider  Office Location  [facility] Dating  LMP  Language  English Anatomy US  Scheduled for 5/17  Flu Vaccine   Genetic Screen  Late to care   TDaP vaccine   10/17/17 Hgb A1C or  GTT  Third trimester: 1hr GTT 66  Rhogam  n/a   LAB RESULTS   Feeding Plan Bottle Blood Type   O Pos   Contraception Depo Antibody  Negative   Circumcision Yes if Boy Rubella  Immune, 1.08  Pediatrician  List given RPR   Non Reactive   Support Person Victor(FOB) HBsAg   Negative   Prenatal Classes Declined  HIV  Non Reactive   BTL Consent N/A GBS  Negative   VBAC Consent N/A Pap  need PP     Hgb Electro      CF     SMA     Waterbirth   Class  Consent  CNM visit    Past Medical History: Past Medical History:  Diagnosis Date  . Medical history non-contributory     Past Surgical History: Past Surgical History:  Procedure Laterality Date  . NO PAST SURGERIES      Obstetrical History: OB History    Gravida  1   Para  0   Term  0   Preterm  0   AB  0   Living  0     SAB  0   TAB  0   Ectopic  0   Multiple  0   Live Births  0           Social History: Social History   Socioeconomic History  . Marital status: Single    Spouse name: Not on file  . Number of children: Not on file  . Years of education: Not on file  . Highest education level: Not on file  Occupational History  . Not on file  Social Needs  . Financial resource strain: Not on file  . Food  insecurity:    Worry: Not on file    Inability: Not on file  . Transportation needs:    Medical: Not on file    Non-medical: Not on file  Tobacco Use  . Smoking status: Former Smoker    Packs/day: 0.25    Years: 1.00    Pack years: 0.25    Types: Cigars    Last attempt to quit: 08/30/2017    Years since quitting: 0.1  . Smokeless tobacco: Never Used  Substance and Sexual Activity  . Alcohol use: Not Currently  . Drug use: Not Currently    Types: Marijuana  . Sexual activity: Yes    Birth control/protection: None  Lifestyle  . Physical activity:    Days per week: Not on file    Minutes per session: Not on file  . Stress: Not on file  Relationships  . Social connections:    Talks on phone: Not on file    Gets  together: Not on file    Attends religious service: Not on file    Active member of club or organization: Not on file    Attends meetings of clubs or organizations: Not on file    Relationship status: Not on file  Other Topics Concern  . Not on file  Social History Narrative  . Not on file    Family History: Family History  Problem Relation Age of Onset  . Cancer Maternal Grandmother   . Cancer Paternal Grandmother     Allergies: Allergies  Allergen Reactions  . Penicillins Swelling    Has patient had a PCN reaction causing immediate rash, facial/tongue/throat swelling, SOB or lightheadedness with hypotension: yes Has patient had a PCN reaction causing severe rash involving mucus membranes or skin necrosis: no Has patient had a PCN reaction that required hospitalization:no Has patient had a PCN reaction occurring within the last 10 years: {no If all of the above answers are "NO", then may proceed with Cephalosporin use.     Medications Prior to Admission  Medication Sig Dispense Refill Last Dose  . Prenatal Vit-Fe Fumarate-FA (MULTIVITAMIN-PRENATAL) 27-0.8 MG TABS tablet Take 1 tablet by mouth daily at 12 noon.   10/30/2017 at Unknown time  .  nitrofurantoin, macrocrystal-monohydrate, (MACROBID) 100 MG capsule Take 1 capsule (100 mg total) by mouth 2 (two) times daily. (Patient not taking: Reported on 10/30/2017) 14 capsule 0 Not Taking     Review of Systems  All systems reviewed and negative except as stated in HPI  Physical Exam Blood pressure 138/85, pulse (!) 102, temperature 98 F (36.7 C), temperature source Oral, resp. rate 16, height 5' 4.5" (1.638 m), weight 166 lb 3.2 oz (75.4 kg), last menstrual period 01/23/2017. General appearance: alert, cooperative and no distress Lungs: clear to auscultation bilaterally Heart: regular rate and rhythm Abdomen: soft, non-tender; bowel sounds normal Extremities: No calf swelling or tenderness Presentation: cephalic Fetal monitoring: 130/ moderate/ +accels/ no decels  Uterine activity: irregular mild contractions  Dilation: 1 Effacement (%): Thick Station: -2 Exam by:: Aundria Rud  Prenatal labs: ABO, Rh: --/--/O POS (05/23 1645) Antibody: NEG (05/23 1645) Rubella: 1.08 (05/09 0953) RPR: Non Reactive (05/09 0953)  HBsAg: Negative (05/09 0953)  HIV: Non Reactive (05/09 0953)  GC/Chlamydia: negative (10/16/17) GBS: Negative (05/09 0000)  1 hr Glucola: 66 (10/17/17) Genetic screening:  Late to care  Anatomy US: Female, IUGR  Prenatal Transfer Tool  Maternal Diabetes: No Genetic Screening: Late to care Maternal Ultrasounds/Referrals: Abnormal:  Findings:   IUGR Fetal Ultrasounds or other Referrals:  None Maternal Substance Abuse:  No Significant Maternal Medications:  None Significant Maternal Lab Results: Lab values include: Group B Strep negative  Results for orders placed or performed during the hospital encounter of 10/30/17 (from the past 24 hour(s))  CBC   Collection Time: 10/30/17  4:45 PM  Result Value Ref Range   WBC 9.8 4.0 - 10.5 K/uL   RBC 4.30 3.87 - 5.11 MIL/uL   Hemoglobin 12.3 12.0 - 15.0 g/dL   HCT 40.9 81.1 - 91.4 %   MCV 85.6 78.0 - 100.0 fL   MCH 28.6  26.0 - 34.0 pg   MCHC 33.4 30.0 - 36.0 g/dL   RDW 78.2 95.6 - 21.3 %   Platelets 256 150 - 400 K/uL  Type and screen [facility]   Collection Time: 10/30/17  4:45 PM  Result Value Ref Range   ABO/RH(D) O POS    Antibody Screen NEG  Sample Expiration      11/02/2017 Performed at [facility], [address] [place], [place] [address]     Patient Active Problem List   Diagnosis Date Noted  . Labor and delivery indication for care or intervention 10/30/2017  . Supervision of normal first pregnancy, antepartum 10/17/2017  . Late prenatal care 10/17/2017  . Uterine size date discrepancy pregnancy, third trimester 10/17/2017    Assessment: Diane Ramirez is a 27 y.o. G1P0000 at [redacted]w[redacted]d here for IOL for IUGR with elevated dopplers   #Labor: IOL with Cytotec. FB placed 1747.  #Pain: IV Fentanyl given during FB placement, plans epidural- medication ordered PRN  #FWB: Cat I #ID:  GBS neg #MOF: Breast/Bottle  #MOC:Depo  #Circ:  Yes (outpatient)  Sharyon Cable, CNM 10/30/2017, 6:25 PM

## 2017-10-30 NOTE — Progress Notes (Signed)
Labor Progress Note Georgianne Gritz is a 27 y.o. G1P0000 at [redacted]w[redacted]d presented for IOL for IUGR with elevated dopplers.  S: Patient able to feel contractions more frequently and endorses +FM.  The pain is moderately controlled with medication.  She would like an epidural.  O:  BP (!) 107/58   Pulse (!) 114   Temp 99.3 F (37.4 C) (Axillary)   Resp 16   Ht 5' 4.5" (1.638 m)   Wt 166 lb 3.2 oz (75.4 kg)   LMP 01/23/2017 (Exact Date)   BMI 28.09 kg/m  EFM: 160bpm, moderate variability, reactive accelerations, early deceleration  CVE: Dilation: 1 Effacement (%): Thick Station: -2 Presentation: Vertex Exam by:: Aundria Rud   A&P: 27 y.o. G1P0000 [redacted]w[redacted]d for IOL for IUGR with elevated dopplers. #Labor: Continue FB and cytotec. #Pain: Analgesics as needed. #FWB: Cat I #GBS negative   Alfonso Ellis, Medical Student 9:11 PM

## 2017-10-30 NOTE — Procedures (Signed)
Diane Ramirez 17-Jun-1990 [redacted]w[redacted]d  Fetus A Non-Stress Test Interpretation for 10/30/17  Indication: Unsatisfactory BPP  Fetal Heart Rate A Mode: External Baseline Rate (A): 130 bpm Variability: Moderate Accelerations: 15 x 15 Decelerations: None Multiple birth?: No  Uterine Activity Mode: Palpation, Toco Contraction Frequency (min): 1 UC Contraction Duration (sec): 45 Contraction Quality: Mild Resting Tone Palpated: Relaxed Resting Time: Adequate  Interpretation (Fetal Testing) Nonstress Test Interpretation: Reactive Comments: EFM tracing reviewed by Dr. Vincenza Hews

## 2017-10-30 NOTE — ED Notes (Signed)
Dr. Vincenza Hews awaiting U/S report from another facility to confirm dating. Patient remains in U/S room. Offered fluids and crackers. Patient declined. She has her own water.

## 2017-10-30 NOTE — ED Notes (Signed)
Dr. Vincenza Hews called report to Dr. Macon Large. RN called report to Belenda Cruise, RN, Charge Nurse in [facility]. Patient escorted to MAU desk for registration.

## 2017-10-31 ENCOUNTER — Inpatient Hospital Stay (HOSPITAL_COMMUNITY): Payer: Medicaid Other | Admitting: Anesthesiology

## 2017-10-31 ENCOUNTER — Encounter (HOSPITAL_COMMUNITY): Payer: Self-pay | Admitting: Neonatal-Perinatal Medicine

## 2017-10-31 ENCOUNTER — Encounter: Payer: Medicaid Other | Admitting: Obstetrics and Gynecology

## 2017-10-31 DIAGNOSIS — O365930 Maternal care for other known or suspected poor fetal growth, third trimester, not applicable or unspecified: Secondary | ICD-10-CM

## 2017-10-31 DIAGNOSIS — N750 Cyst of Bartholin's gland: Secondary | ICD-10-CM

## 2017-10-31 DIAGNOSIS — Z3A40 40 weeks gestation of pregnancy: Secondary | ICD-10-CM

## 2017-10-31 LAB — RAPID URINE DRUG SCREEN, HOSP PERFORMED
Amphetamines: NOT DETECTED
BARBITURATES: NOT DETECTED
Benzodiazepines: NOT DETECTED
COCAINE: NOT DETECTED
Opiates: NOT DETECTED
Tetrahydrocannabinol: NOT DETECTED

## 2017-10-31 LAB — CBC
HEMATOCRIT: 29.5 % — AB (ref 36.0–46.0)
Hemoglobin: 9.7 g/dL — ABNORMAL LOW (ref 12.0–15.0)
MCH: 28.4 pg (ref 26.0–34.0)
MCHC: 32.9 g/dL (ref 30.0–36.0)
MCV: 86.3 fL (ref 78.0–100.0)
Platelets: 166 10*3/uL (ref 150–400)
RBC: 3.42 MIL/uL — ABNORMAL LOW (ref 3.87–5.11)
RDW: 13.3 % (ref 11.5–15.5)
WBC: 20.7 10*3/uL — AB (ref 4.0–10.5)

## 2017-10-31 LAB — RPR: RPR: NONREACTIVE

## 2017-10-31 MED ORDER — BENZOCAINE-MENTHOL 20-0.5 % EX AERO
1.0000 "application " | INHALATION_SPRAY | CUTANEOUS | Status: DC | PRN
  Administered 2017-11-01: 1 via TOPICAL
  Filled 2017-10-31: qty 56

## 2017-10-31 MED ORDER — PHENYLEPHRINE 40 MCG/ML (10ML) SYRINGE FOR IV PUSH (FOR BLOOD PRESSURE SUPPORT)
80.0000 ug | PREFILLED_SYRINGE | INTRAVENOUS | Status: DC | PRN
  Filled 2017-10-31: qty 5
  Filled 2017-10-31: qty 10

## 2017-10-31 MED ORDER — ONDANSETRON HCL 4 MG/2ML IJ SOLN: 4.0000 mg | INTRAMUSCULAR | Status: DC | PRN

## 2017-10-31 MED ORDER — TETANUS-DIPHTH-ACELL PERTUSSIS 5-2.5-18.5 LF-MCG/0.5 IM SUSP: 0.5000 mL | Freq: Once | INTRAMUSCULAR | Status: DC

## 2017-10-31 MED ORDER — SULFAMETHOXAZOLE-TRIMETHOPRIM 800-160 MG PO TABS
1.0000 | ORAL_TABLET | Freq: Two times a day (BID) | ORAL | Status: DC
  Administered 2017-10-31: 1 via ORAL
  Filled 2017-10-31 (×3): qty 1

## 2017-10-31 MED ORDER — LACTATED RINGERS IV SOLN
INTRAVENOUS | Status: DC
  Administered 2017-10-31: 06:00:00 via INTRAUTERINE

## 2017-10-31 MED ORDER — PRENATAL MULTIVITAMIN CH
1.0000 | ORAL_TABLET | Freq: Every day | ORAL | Status: DC
  Administered 2017-11-01: 1 via ORAL
  Filled 2017-10-31: qty 1

## 2017-10-31 MED ORDER — SIMETHICONE 80 MG PO CHEW: 80.0000 mg | CHEWABLE_TABLET | ORAL | Status: DC | PRN

## 2017-10-31 MED ORDER — DIBUCAINE 1 % RE OINT: 1.0000 "application " | TOPICAL_OINTMENT | RECTAL | Status: DC | PRN

## 2017-10-31 MED ORDER — COCONUT OIL OIL: 1.0000 "application " | TOPICAL_OIL | Status: DC | PRN

## 2017-10-31 MED ORDER — FENTANYL 2.5 MCG/ML BUPIVACAINE 1/10 % EPIDURAL INFUSION (WH - ANES)
14.0000 mL/h | INTRAMUSCULAR | Status: DC | PRN
  Administered 2017-10-31 (×2): 14 mL/h via EPIDURAL
  Filled 2017-10-31 (×2): qty 100

## 2017-10-31 MED ORDER — DIPHENHYDRAMINE HCL 25 MG PO CAPS: 25.0000 mg | ORAL_CAPSULE | Freq: Four times a day (QID) | ORAL | Status: DC | PRN

## 2017-10-31 MED ORDER — EPHEDRINE 5 MG/ML INJ
10.0000 mg | INTRAVENOUS | Status: DC | PRN
  Filled 2017-10-31: qty 2

## 2017-10-31 MED ORDER — ACETAMINOPHEN 325 MG PO TABS
650.0000 mg | ORAL_TABLET | ORAL | Status: DC | PRN
Start: 2017-10-31 — End: 2017-11-02

## 2017-10-31 MED ORDER — LIDOCAINE HCL (PF) 1 % IJ SOLN
INTRAMUSCULAR | Status: DC | PRN
  Administered 2017-10-31: 8 mL via EPIDURAL

## 2017-10-31 MED ORDER — ONDANSETRON HCL 4 MG PO TABS: 4.0000 mg | ORAL_TABLET | ORAL | Status: DC | PRN

## 2017-10-31 MED ORDER — PHENYLEPHRINE 40 MCG/ML (10ML) SYRINGE FOR IV PUSH (FOR BLOOD PRESSURE SUPPORT)
80.0000 ug | PREFILLED_SYRINGE | INTRAVENOUS | Status: DC | PRN
  Filled 2017-10-31: qty 5

## 2017-10-31 MED ORDER — DIPHENHYDRAMINE HCL 50 MG/ML IJ SOLN: 12.5000 mg | INTRAMUSCULAR | Status: DC | PRN

## 2017-10-31 MED ORDER — IBUPROFEN 600 MG PO TABS
600.0000 mg | ORAL_TABLET | Freq: Four times a day (QID) | ORAL | Status: DC
  Administered 2017-10-31 – 2017-11-02 (×8): 600 mg via ORAL
  Filled 2017-10-31 (×8): qty 1

## 2017-10-31 MED ORDER — ZOLPIDEM TARTRATE 5 MG PO TABS: 5.0000 mg | ORAL_TABLET | Freq: Every evening | ORAL | Status: DC | PRN

## 2017-10-31 MED ORDER — TERBUTALINE SULFATE 1 MG/ML IJ SOLN: 0.2500 mg | Freq: Once | INTRAMUSCULAR | Status: DC

## 2017-10-31 MED ORDER — WITCH HAZEL-GLYCERIN EX PADS: 1.0000 "application " | MEDICATED_PAD | CUTANEOUS | Status: DC | PRN

## 2017-10-31 MED ORDER — SENNOSIDES-DOCUSATE SODIUM 8.6-50 MG PO TABS
2.0000 | ORAL_TABLET | ORAL | Status: DC
  Administered 2017-11-01 – 2017-11-02 (×2): 2 via ORAL
  Filled 2017-10-31 (×2): qty 2

## 2017-10-31 MED ORDER — LACTATED RINGERS IV SOLN: 500.0000 mL | Freq: Once | INTRAVENOUS | Status: DC

## 2017-10-31 MED ORDER — LACTATED RINGERS IV BOLUS
1000.0000 mL | Freq: Once | INTRAVENOUS | Status: AC
  Administered 2017-10-31: 1000 mL via INTRAVENOUS

## 2017-10-31 NOTE — Anesthesia Procedure Notes (Addendum)
Epidural Patient location during procedure: OB Start time: 10/31/2017 3:35 AM End time: 10/31/2017 3:40 AM  Staffing Anesthesiologist: Bethena Midget, MD  Preanesthetic Checklist Completed: patient identified, site marked, surgical consent, pre-op evaluation, timeout performed, IV checked, risks and benefits discussed and monitors and equipment checked  Epidural Patient position: sitting Prep: site prepped and draped and DuraPrep Patient monitoring: continuous pulse ox and blood pressure Approach: midline Location: L4-L5 Injection technique: LOR air  Needle:  Needle type: Tuohy  Needle gauge: 17 G Needle length: 9 cm and 9 Needle insertion depth: 6 cm Catheter type: closed end flexible Catheter size: 19 Gauge Catheter at skin depth: 11 cm Test dose: negative and Other  Assessment Events: blood not aspirated, injection not painful, no injection resistance, negative IV test and no paresthesia

## 2017-10-31 NOTE — Progress Notes (Signed)
Vitals:   10/30/17 2155 10/30/17 2323  BP: 118/64 (!) 115/59  Pulse: 85 74  Resp: 18 16  Temp: 99.3 F (37.4 C) 98.7 F (37.1 C)   Foley still in . PT w/repetitive vari bale decels, ? lateunresponsive to IVF/position chagres. Ctx pattern difficult to assess, seems to be q 1-2 minutes. Terbutaline 0.25mg  given SQ. FHR now reactive w/o decels.

## 2017-10-31 NOTE — Progress Notes (Signed)
Vitals:   10/31/17 0601 10/31/17 0631  BP: 118/69 (!) 132/93  Pulse: 89 91  Resp: 18 18  Temp: 98.9 F (37.2 C)   SpO2:    . Has had several prolonged variable decels. Amnioinfusion started a few hours ago. FHR w/mod variability, + accels, and early decels. MVU's 200-240.  cx 6/90/-2.  Despite some periods of Cat 2 tracing, FHR is overall reassuring.  Will continue to monitor.

## 2017-10-31 NOTE — Anesthesia Pain Management Evaluation Note (Signed)
  CRNA Pain Management Visit Note  Patient: Diane Ramirez, 27 y.o., female  "Hello I am a member of the anesthesia team at [facility]. We have an anesthesia team available at all times to provide care throughout the hospital, including epidural management and anesthesia for C-section. I don't know your plan for the delivery whether it a natural birth, water birth, IV sedation, nitrous supplementation, doula or epidural, but we want to meet your pain goals."   1.Was your pain managed to your expectations on prior hospitalizations?   No prior hospitalizations  2.What is your expectation for pain management during this hospitalization?     Epidural  3.How can we help you reach that goal? unsure  Record the patient's initial score and the patient's pain goal.   Pain: 4  Pain Goal: 10 [facility] wants you to be able to say your pain was always managed very well.  Cephus Shelling 10/31/2017

## 2017-10-31 NOTE — Progress Notes (Signed)
Foley out, IUPC placed. PT contracting q 1-3 mintues, MVUs 250.  FHR w/early/occ prolonged variable decel that responds to position change. Wants epidural  Cx 6/90/-2. Plan for epidural. No augmentation needed.

## 2017-10-31 NOTE — Progress Notes (Signed)
Labor Progress Note Diane Ramirez is a 27 y.o. G1P0000 at [redacted]w[redacted]d presented for IOL for IUGR with elevated dopplers.  S:  Patient able to feel contractions.  FB has fallen out.  IUPC was placed. CNM noticed bartholin cyst during  Placement of IUPC. Patient was crying from pain.    O:  BP 121/75   Pulse 91   Temp 98.1 F (36.7 C) (Oral)   Resp 18   Ht 5' 4.5" (1.638 m)   Wt 166 lb 3.2 oz (75.4 kg)   LMP 01/23/2017 (Exact Date)   SpO2 97%   BMI 28.09 kg/m  EFM: 150bpm, moderate variability, reactive accelerations, reactive accelerations, decelerations present  CVE: Dilation: 6 Effacement (%): 90 Station: -2 Presentation: Vertex Exam by:: Drenda Freeze CNM   A&P: 27 y.o. G1P0000 [redacted]w[redacted]d presented for IOL due to IUGR with elevated dopplers. #Labor: Progressing well.  Expectant management. #Pain: Analgesics as needed.  Patient requests epidural at future time. #FWB: IUPC placement for monitoring of decelerations. #GBS negative   Alfonso Ellis, Medical Student 4:15 AM

## 2017-10-31 NOTE — Anesthesia Preprocedure Evaluation (Signed)
Anesthesia Evaluation  Patient identified by MRN, date of birth, ID band Patient awake    Reviewed: Allergy & Precautions, H&P , Patient's Chart, lab work & pertinent test results  Airway Mallampati: II  TM Distance: >3 FB Neck ROM: full    Dental no notable dental hx. (+) Teeth Intact   Pulmonary neg pulmonary ROS, former smoker,    Pulmonary exam normal breath sounds clear to auscultation       Cardiovascular negative cardio ROS Normal cardiovascular exam Rhythm:regular Rate:Normal     Neuro/Psych negative neurological ROS  negative psych ROS   GI/Hepatic negative GI ROS, Neg liver ROS,   Endo/Other  negative endocrine ROS  Renal/GU negative Renal ROS  negative genitourinary   Musculoskeletal   Abdominal   Peds  Hematology negative hematology ROS (+)   Anesthesia Other Findings   Reproductive/Obstetrics (+) Pregnancy                             Anesthesia Physical Anesthesia Plan  ASA: II  Anesthesia Plan: Epidural   Post-op Pain Management:    Induction:   PONV Risk Score and Plan:   Airway Management Planned:   Additional Equipment:   Intra-op Plan:   Post-operative Plan:   Informed Consent: I have reviewed the patients History and Physical, chart, labs and discussed the procedure including the risks, benefits and alternatives for the proposed anesthesia with the patient or authorized representative who has indicated his/her understanding and acceptance.     Plan Discussed with: Anesthesiologist  Anesthesia Plan Comments:         Anesthesia Quick Evaluation

## 2017-10-31 NOTE — Progress Notes (Signed)
At 0915, patient was resting after delivery. Pt was given explanation that the foley catheter had remained in during birth due to a rapid delivery. Pt reminded her legs were numb and not to get out of bed. Pt told about the Stedy that would be utilized at the end of her recovery. Side rails were put up and pt was given the call bell, pt was alone in the room except for the RN. I stayed with the patient until giving report in the room to Jodi Geralds RN at [phone]. Pt was able to flex knees and lift hips at the delivery of placenta.

## 2017-10-31 NOTE — Progress Notes (Signed)
Vital signs obtained. Blood pressure 93/32, pulse 108, respirations 16, and temperature is 102. Raelyn Mora, CNM made aware. Okay to give Tylenol 650 mg PO now. Patient made aware and verbalizes an understanding.

## 2017-10-31 NOTE — Anesthesia Postprocedure Evaluation (Signed)
Anesthesia Post Note  Patient: Civil engineer, contracting  Procedure(s) Performed: AN AD HOC LABOR EPIDURAL     Patient location during evaluation: Mother Baby Anesthesia Type: Epidural Level of consciousness: awake Pain management: pain level controlled Vital Signs Assessment: post-procedure vital signs reviewed and stable Respiratory status: spontaneous breathing Postop Assessment: patient able to bend at knees and epidural receding Anesthetic complications: no    Last Vitals:  Vitals:   10/31/17 1257 10/31/17 1315  BP: (!) 86/56 (!) 77/54  Pulse: (!) 115 (!) 127  Resp: 16   Temp: 37 C   SpO2: 99% 97%    Last Pain:  Vitals:   10/31/17 1400  TempSrc:   PainSc: 0-No pain   Pain Goal:                 Edison Pace

## 2017-10-31 NOTE — Progress Notes (Signed)
PRN Tylenol 650 mg PO given at this time.

## 2017-10-31 NOTE — Progress Notes (Signed)
Called to room by family members. Upon arrival to Room 169, Patient noted to be in bed with legs hanging off the side of the bed. Mother of the patient states, "She tried to get up and go to the bathroom and slid to the floor." Small amount of blood noted on the floor. Patient denies any physical injuries. Patient also denies hitting her head. Patient's significant other states, "She didn't hit anything. She really didn't fall. I saw her getting up, so I ran over here and helped her to the floor." Patient states that she was going to the bathroom because she had to vomit. Reminded the patient that she should not get up without assistance from staff and to use her call-bell when assistance is needed. Patient verbalizes understanding. Also reminded Patient that she was given two emesis bags that are beside her in bed that she can use if she needs to vomit. Patient states, "I know. I don't know what I was thinking." Patient assisted back to bed without any difficulty.

## 2017-10-31 NOTE — Progress Notes (Signed)
Labor Progress Note Diane Ramirez is a 27 y.o. G1P0000 at [redacted]w[redacted]d presented for IOL for IUGR with elevated dopplers.  S: Patient visibly uncomfortably laying in bed from contractions.  She requested more pain medications.  RN unable to remove FB, gave patient fluid bolus and tried positional changes.  Patient was given terbutaline for contractions.  O:  BP (!) 115/59   Pulse 74   Temp 98.7 F (37.1 C) (Oral)   Resp 16   Ht 5' 4.5" (1.638 m)   Wt 166 lb 3.2 oz (75.4 kg)   LMP 01/23/2017 (Exact Date)   BMI 28.09 kg/m  EFM: 150bpm, moderate variability, reactive accelerations, variable decelerations resolved   CVE: Dilation: 1 Effacement (%): Thick Station: -2 Presentation: Vertex Exam by:: Aundria Rud   A&P: 27 y.o. G1P0000 [redacted]w[redacted]d presented for IOL for IUGR with elevated dopplers.  #Labor: Continue FB and cytotec. #Pain: Analgesics as needed. #FWB: Cat III - resolved with terbutaline  #GBS negative   Alfonso Ellis, Medical Student 12:28 AM

## 2017-11-01 NOTE — Progress Notes (Signed)
Pt Sleeping

## 2017-11-01 NOTE — Lactation Note (Signed)
This note was copied from a baby's chart. Lactation Consultation Note  Patient Name: Diane Ramirez Date: 11/01/2017 Reason for consult: Initial assessment;NICU baby;Infant < 6lbs   P1, Baby 29 hours in NICU. Reviewed hand expression. Provided mother w/ NICU booklet and labels. Recommend mother post pump 8 times per day for 10-20 min with DEBP on initiation setting. Reviewed cleaning and milk storage.  Mother states she has pumped a few times.  Encouraged pumping w/ hand expression q2.5-3hours. Mom made aware of O/P services, breastfeeding support groups, community resources, and our phone # for post-discharge questions.      Maternal Data Has patient been taught Hand Expression?: Yes Does the patient have breastfeeding experience prior to this delivery?: No  Feeding    LATCH Score                   Interventions    Lactation Tools Discussed/Used Pump Review: Setup, frequency, and cleaning;Milk Storage Initiated by:: RN and LC Date initiated:: 10/31/17   Consult Status Consult Status: Follow-up Date: 11/02/17 Follow-up type: In-patient    [name] [facility] 11/01/2017, 2:34 PM

## 2017-11-01 NOTE — Clinical Social Work Maternal (Signed)
CLINICAL SOCIAL WORK MATERNAL/CHILD NOTE  Patient Details  Name: Diane Ramirez MRN: [id number] Date of Birth: 03/09/1991  Date:  11/01/2017  Clinical Social Worker Initiating Note:  Cerinity Zynda, MSW, LCSW-A Date/Time: Initiated:  11/01/17/1338     Child's Name:  Diane Ramirez   Biological Parents:  Mother, Father   Need for Interpreter:  None   Reason for Referral:  Current Substance Use/Substance Use During Pregnancy    Address:  [address] [place] [address]    Phone number:  [phone] (home)     Additional phone number:    Household Members/Support Persons (HM/SP):   Household Member/Support Person 1   HM/SP Name Relationship DOB or Age  HM/SP -1 Juankita Green Mother    HM/SP -2        HM/SP -3        HM/SP -4        HM/SP -5        HM/SP -6        HM/SP -7        HM/SP -8          Natural Supports (not living in the home):  Extended Family, Friends, Immediate Family   Professional Supports: None   Employment: Full-time   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[employer], Assistant Manager   Education:  High school graduate   Homebound arranged:    Financial Resources:  Medicaid   Other Resources:  WIC   Cultural/Religious Considerations Which May Impact Care:  None  Strengths:  Ability to meet basic needs , Pediatrician chosen, Home prepared for child    Psychotropic Medications:   None      Pediatrician:    [place] area  Pediatrician List:   [place] (Peds)  [place]    [place]    [place]    [place]    [place]      Pediatrician Fax Number:    Risk Factors/Current Problems:      Cognitive State:  Able to Concentrate , Alert    Mood/Affect:  Calm , Comfortable    CSW Assessment: CSW spoke with patient to discuss reason for consult which was history of marijuana use during pregnancy. Patient had + UDS for marijuana on 10/16/17 but was negative upon admission to  [facility] on 10/31/17. Newborn is Diane Mault, who's UDS was negative upon birth. FOB is Victor Martinez. CSW and patient discussed pediatric care for Diane, which will be [facility] with Dr. Gay. Patient reports having a bassinet for Diane, safe sleep and SIDS reduction methods were discussed. Patient reports having a brand new car seat for infant with knowledge of installation and use. Patient reports only receiving Medicaid at this time, WIC resource was discussed. CSW explained to patient about hospital drug screening policies and that a report would be made if there were any substances found in Diane's cord results. Patient stated agreement and understanding and did not have further questions. Patient reports having a good support system, she lives with her mother and little brother. 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 CSW encouraged patient to reach out for assistance if needs or questions arise before or after discharge, patient in agreement.  CSW Plan/Description:  No Further Intervention Required/No Barriers to Discharge, CSW Will Continue to Monitor Umbilical Cord Tissue Drug Screen Results and Make Report if Warranted, Hospital Drug Screen Policy Information, Perinatal   Mood and Anxiety Disorder (PMADs) Education, Sudden Infant Death Syndrome (SIDS) Education    Oviya Ammar L Avanell Banwart, LCSWA 11/01/2017, 1:47 PM   

## 2017-11-01 NOTE — Progress Notes (Signed)
Post Partum Day 1 Subjective: Pt without complaints. Ambulating and voiding without problems. Pain controlled. Tolerating diet. Breast pumping  Objective: Blood pressure 125/65, pulse 78, temperature 98.6 F (37 C), temperature source Oral, resp. rate 16, height 5' 4.5" (1.638 m), weight 75.4 kg (166 lb 3.2 oz), last menstrual period 01/23/2017, SpO2 100 %, unknown if currently breastfeeding.  Physical Exam:  General: alert Lochia: appropriate Uterine Fundus: firm Incision: healing well DVT Evaluation: No evidence of DVT seen on physical exam.  Recent Labs    10/30/17 1645 10/31/17 1339  HGB 12.3 9.7*  HCT 36.8 29.5*    Assessment/Plan: Plan for discharge tomorrow   LOS: 2 days   Diane Ramirez 11/01/2017, 9:54 AM

## 2017-11-02 MED ORDER — SENNOSIDES-DOCUSATE SODIUM 8.6-50 MG PO TABS: 2.0000 | ORAL_TABLET | ORAL | 1 refills | Status: DC

## 2017-11-02 MED ORDER — IBUPROFEN 600 MG PO TABS: 600.0000 mg | ORAL_TABLET | Freq: Four times a day (QID) | ORAL | 0 refills | Status: DC

## 2017-11-02 NOTE — Progress Notes (Signed)
Patient reportedly "very stressed" at time discharge instructions were given. Nurse questioned patients readiness to hear instructions and was told she would remember. Assessed patient regarding risk for suicide. Family did not wait for patient on the floor while discharge instructions were given. Nurse walked patient to the car. Patient reportedly "stressed" regarding baby in the NICU. Nurse provided reassurance and encouraged patient to follow up with MD regarding any signs of post partum blues or depression.

## 2017-11-02 NOTE — Discharge Summary (Signed)
Obstetric Discharge Summary Reason for Admission: induction of labor for IUGR and abnormal doppler studies Prenatal Procedures: ultrasound Intrapartum Procedures: spontaneous vaginal delivery Postpartum Procedures: none Complications-Operative and Postpartum: none Hemoglobin  Date Value Ref Range Status  10/31/2017 9.7 (L) 12.0 - 15.0 g/dL Final    Comment:    REPEATED TO VERIFY DELTA CHECK NOTED   10/17/2017 11.8 11.1 - 15.9 g/dL Final   HCT  Date Value Ref Range Status  10/31/2017 29.5 (L) 36.0 - 46.0 % Final   Hematocrit  Date Value Ref Range Status  10/17/2017 35.1 34.0 - 46.6 % Final   Home: Pt admitted to hospital for IOL secondary to above indications. Pt had limited prenatal care. TSVD without problems. See delivery note for additional information. Postpartum course was unremarkable. Progressed to ambulating, voiding, tolerating diet and good oral pain control. Felt amenable for discharge home on PPD # 2. Discharge medications and instructions reviewed with pt. She verbalized understanding.  Physical Exam:  General: alert Lochia: appropriate Uterine Fundus: firm Incision: healing well DVT Evaluation: No evidence of DVT seen on physical exam.  Discharge Diagnoses: Term Pregnancy-delivered  Discharge Information: Date: 11/02/2017 Activity: pelvic rest Diet: routine Medications: Ibuprofen and Senakot Condition: stable Instructions: refer to practice specific booklet Discharge to: home Follow-up Information    [facility]. Schedule an appointment as soon as possible for a visit in 4 week(s).   Why:  Postpartum visit in 4 weeks Contact information: [address] [place] [address] [phone]          Newborn Data: Live born female  Birth Weight: 5 lb 10.7 oz (2570 g) APGAR: 1, 4  Newborn Delivery   Birth date/time:  10/31/2017 08:51:00 Delivery type:  Vaginal, Spontaneous      Infant stable in NICU  Diane Ramirez 11/02/2017, 7:08 AM

## 2017-11-02 NOTE — Discharge Instructions (Signed)
Vaginal Delivery, Care After °Refer to this sheet in the next few weeks. These instructions provide you with information about caring for yourself after vaginal delivery. Your health care provider may also give you more specific instructions. Your treatment has been planned according to current medical practices, but problems sometimes occur. Call your health care provider if you have any problems or questions. °What can I expect after the procedure? °After vaginal delivery, it is common to have: °· Some bleeding from your vagina. °· Soreness in your abdomen, your vagina, and the area of skin between your vaginal opening and your anus (perineum). °· Pelvic cramps. °· Fatigue. ° °Follow these instructions at home: °Medicines °· Take over-the-counter and prescription medicines only as told by your health care provider. °· If you were prescribed an antibiotic medicine, take it as told by your health care provider. Do not stop taking the antibiotic until it is finished. °Driving ° °· Do not drive or operate heavy machinery while taking prescription pain medicine. °· Do not drive for 24 hours if you received a sedative. °Lifestyle °· Do not drink alcohol. This is especially important if you are breastfeeding or taking medicine to relieve pain. °· Do not use tobacco products, including cigarettes, chewing tobacco, or e-cigarettes. If you need help quitting, ask your health care provider. °Eating and drinking °· Drink at least 8 eight-ounce glasses of water every day unless you are told not to by your health care provider. If you choose to breastfeed your baby, you may need to drink more water than this. °· Eat high-fiber foods every day. These foods may help prevent or relieve constipation. High-fiber foods include: °? Whole grain cereals and breads. °? Brown rice. °? Beans. °? Fresh fruits and vegetables. °Activity °· Return to your normal activities as told by your health care provider. Ask your health care provider  what activities are safe for you. °· Rest as much as possible. Try to rest or take a nap when your baby is sleeping. °· Do not lift anything that is heavier than your baby or 10 lb (4.5 kg) until your health care provider says that it is safe. °· Talk with your health care provider about when you can engage in sexual activity. This may depend on your: °? Risk of infection. °? Rate of healing. °? Comfort and desire to engage in sexual activity. °Vaginal Care °· If you have an episiotomy or a vaginal tear, check the area every day for signs of infection. Check for: °? More redness, swelling, or pain. °? More fluid or blood. °? Warmth. °? Pus or a bad smell. °· Do not use tampons or douches until your health care provider says this is safe. °· Watch for any blood clots that may pass from your vagina. These may look like clumps of dark red, brown, or black discharge. °General instructions °· Keep your perineum clean and dry as told by your health care provider. °· Wear loose, comfortable clothing. °· Wipe from front to back when you use the toilet. °· Ask your health care provider if you can shower or take a bath. If you had an episiotomy or a perineal tear during labor and delivery, your health care provider may tell you not to take baths for a certain length of time. °· Wear a bra that supports your breasts and fits you well. °· If possible, have someone help you with household activities and help care for your baby for at least a few days after   you leave the hospital. °· Keep all follow-up visits for you and your baby as told by your health care provider. This is important. °Contact a health care provider if: °· You have: °? Vaginal discharge that has a bad smell. °? Difficulty urinating. °? Pain when urinating. °? A sudden increase or decrease in the frequency of your bowel movements. °? More redness, swelling, or pain around your episiotomy or vaginal tear. °? More fluid or blood coming from your episiotomy or  vaginal tear. °? Pus or a bad smell coming from your episiotomy or vaginal tear. °? A fever. °? A rash. °? Little or no interest in activities you used to enjoy. °? Questions about caring for yourself or your baby. °· Your episiotomy or vaginal tear feels warm to the touch. °· Your episiotomy or vaginal tear is separating or does not appear to be healing. °· Your breasts are painful, hard, or turn red. °· You feel unusually sad or worried. °· You feel nauseous or you vomit. °· You pass large blood clots from your vagina. If you pass a blood clot from your vagina, save it to show to your health care provider. Do not flush blood clots down the toilet without having your health care provider look at them. °· You urinate more than usual. °· You are dizzy or light-headed. °· You have not breastfed at all and you have not had a menstrual period for 12 weeks after delivery. °· You have stopped breastfeeding and you have not had a menstrual period for 12 weeks after you stopped breastfeeding. °Get help right away if: °· You have: °? Pain that does not go away or does not get better with medicine. °? Chest pain. °? Difficulty breathing. °? Blurred vision or spots in your vision. °? Thoughts about hurting yourself or your baby. °· You develop pain in your abdomen or in one of your legs. °· You develop a severe headache. °· You faint. °· You bleed from your vagina so much that you fill two sanitary pads in one hour. °This information is not intended to replace advice given to you by your health care provider. Make sure you discuss any questions you have with your health care provider. °Document Released: 05/24/2000 Document Revised: 11/08/2015 Document Reviewed: 06/11/2015 °Elsevier Interactive Patient Education © 2018 Elsevier Inc. ° °

## 2017-11-25 ENCOUNTER — Ambulatory Visit (INDEPENDENT_AMBULATORY_CARE_PROVIDER_SITE_OTHER): Payer: Medicaid Other | Admitting: Clinical

## 2017-11-25 ENCOUNTER — Encounter: Payer: Self-pay | Admitting: Family Medicine

## 2017-11-25 ENCOUNTER — Ambulatory Visit (INDEPENDENT_AMBULATORY_CARE_PROVIDER_SITE_OTHER): Payer: Medicaid Other | Admitting: Family Medicine

## 2017-11-25 ENCOUNTER — Other Ambulatory Visit (HOSPITAL_COMMUNITY)
Admission: RE | Admit: 2017-11-25 | Discharge: 2017-11-25 | Disposition: A | Discharge status: Home / Self Care | Payer: Medicaid Other | Source: Ambulatory Visit | Attending: Family Medicine | Admitting: Family Medicine

## 2017-11-25 DIAGNOSIS — Z658 Other specified problems related to psychosocial circumstances: Secondary | ICD-10-CM | POA: Diagnosis not present

## 2017-11-25 DIAGNOSIS — Z30013 Encounter for initial prescription of injectable contraceptive: Secondary | ICD-10-CM

## 2017-11-25 DIAGNOSIS — N870 Mild cervical dysplasia: Secondary | ICD-10-CM | POA: Insufficient documentation

## 2017-11-25 DIAGNOSIS — Z1389 Encounter for screening for other disorder: Secondary | ICD-10-CM

## 2017-11-25 LAB — POCT PREGNANCY, URINE: PREG TEST UR: NEGATIVE

## 2017-11-25 MED ORDER — MEDROXYPROGESTERONE ACETATE 150 MG/ML IM SUSP
150.0000 mg | Freq: Once | INTRAMUSCULAR | Status: AC
  Administered 2017-11-25: 150 mg via INTRAMUSCULAR

## 2017-11-25 NOTE — Progress Notes (Signed)
Subjective:     Diane Ramirez is a [age] y.o. female who presents for a postpartum visit. She is 4 weeks postpartum following a spontaneous vaginal delivery. I have fully reviewed the prenatal and intrapartum course. The delivery was at 40 gestational weeks. Outcome: spontaneous vaginal delivery. Anesthesia: epidural. Postpartum course has been uncomplicated. Baby's course has been complicated by NICU admission, but now baby home and doing well.Diane Ramirez. Baby is feeding by bottle - Similac Advance. Bleeding no bleeding. Bowel function is normal. Bladder function is normal. Patient is not sexually active. Contraception method is Depo-Provera injections. Postpartum depression screening: negative.  The following portions of the patient's history were reviewed and updated as appropriate: allergies, current medications, past family history, past medical history, past social history, past surgical history and problem list.  Review of Systems Pertinent items noted in HPI and remainder of comprehensive ROS otherwise negative.   Objective:    BP 137/86   Pulse 84   LMP 01/23/2017 (Exact Date)    General:  Well-appearing female in NAD   HENT:  [place]/AT, normal oral mucosa  Eyes: Normal conjunctivae, no scleral icterus  Lungs:  Normal respiratory effort  Heart: Normal rate  Abdomen: Soft, ND, ND, +BS  Skin : Warm, dry.   MSK: No LE edema  Psych: Normal mood, appropriate affect  Neuro:  Alert and oriented x3        UPC: negative  Assessment:     Normal postpartum exam. Pap smear done at today's visit.   Plan:    1. Contraception: Counseled extensively on contraceptive methods including LARCs. Depo given today. She will think about IUD. Will followup in 3 months for either IUD or Depo 2. F/u pap smear result 3. Follow up in: 3 months or as needed.    Raynelle FanningJulie P. Tyarra Nolton, MD OB Fellow  Future Appointments  Date Time Provider Department Center  02/17/2018  1:30 PM [facility] [place] [facility]

## 2017-11-25 NOTE — BH Specialist Note (Signed)
Integrated Behavioral Health Initial Visit  MRN: [id number] Name: Diane Ramirez  Number of Integrated Behavioral Health Clinician visits:: 1/6 Session Start time: 1:30  Session End time: 1:49 Total time: 20 minutes  Type of Service: Integrated Behavioral Health- Individual/Family Interpretor:No. Interpretor Name and Language: n/a   Warm Hand Off Completed.       SUBJECTIVE: Diane Ramirez is a 27 y.o. female accompanied by Diane Ramirez Patient was referred by Dr Nira Retort for psychosocial Patient reports the following symptoms/concerns: Pt's primary concern today is fatigue, along with mild adjustment to the childbirth experience and breastfeeding difficulties with a first child. Pt is sleeping when baby sleeps, has regained her appetite, and feels she is coping well at this time. Pt open to attend Mom Talk/breastfeeding support/ baby and me classes for extra support.  Duration of problem: Three weeks; Severity of problem: mild  OBJECTIVE: Mood: Appropriate and Affect: Appropriate Risk of harm to self or others: No plan to harm self or others  LIFE CONTEXT: Family and Social: Pt lives with her mother, 13yo brother, and 3wo Ramirez School/Work: Maternity leave; conflicted feelings about when to return to work Self-Care: Recognizes need for daily self-care, currently in the form of sleeping when baby sleeps as priority Life Changes: Recent childbirth  GOALS ADDRESSED: Patient will: 1. Reduce symptoms of: stress 2. Increase knowledge and/or ability of: stress reduction  3. Demonstrate ability to: Increase healthy adjustment to current life circumstances and Increase adequate support systems for patient/family  INTERVENTIONS: Interventions utilized: Supportive Counseling, Psychoeducation and/or Health Education and Link to [facility]  Standardized Assessments completed: Edinburgh Postnatal Depression, GAD-7 and PHQ 9  ASSESSMENT: Patient currently experiencing Psychosocial stress.    Patient may benefit from psychoeducation and brief therapeutic interventions regarding coping with symptoms of postpartum stress and adjustment to new motherhood. Marland Kitchen  PLAN: 1. Follow up with behavioral health clinician on : As needed 2. Behavioral recommendations:  -Continue sleeping when baby sleeps; eating when hungry, and drinking to thirst -Pick one: Mom Talk/Breastfeeding support/Baby and Me support group to attend in the upcoming week, for additional social support -Consider meditation app to use daily at late night/early morning feeding for additional self-care moments 3. Referral(s): Integrated Behavioral Health Services (In Clinic) 4. "From scale of 1-10, how likely are you to follow plan?": 9  Valetta Close [place], LCSW   Edinburgh Postnatal Depression Scale - 11/25/17 1358      Edinburgh Postnatal Depression Scale:  In the Past 7 Days   I have been able to laugh and see the funny side of things.  0    I have looked forward with enjoyment to things.  0    I have blamed myself unnecessarily when things went wrong.  0    I have been anxious or worried for no good reason.  0    I have felt scared or panicky for no good reason.  0    Things have been getting on top of me.  0    I have been so unhappy that I have had difficulty sleeping.  0    I have felt sad or miserable.  0    I have been so unhappy that I have been crying.  0    The thought of harming myself has occurred to me.  0    Edinburgh Postnatal Depression Scale Total  0      Depression screen [facility] 2/9 11/25/2017 10/24/2017 10/17/2017  Decreased Interest 0 0 0  Down, Depressed, Hopeless  0 0 0  PHQ - 2 Score 0 0 0  Altered sleeping 0 0 0  Tired, decreased energy 0 0 0  Change in appetite 0 0 0  Feeling bad or failure about yourself  0 0 0  Trouble concentrating 0 0 0  Moving slowly or fidgety/restless 0 0 0  Suicidal thoughts 0 0 0  PHQ-9 Score 0 0 0   GAD 7 : Generalized Anxiety Score 11/25/2017 10/24/2017 10/17/2017   Nervous, Anxious, on Edge 0 0 1  Control/stop worrying 0 0 0  Worry too much - different things 0 0 0  Trouble relaxing 0 0 0  Restless 0 0 0  Easily annoyed or irritable 0 0 0  Afraid - awful might happen 0 0 0  Total GAD 7 Score 0 0 1

## 2017-11-27 LAB — CYTOLOGY - PAP

## 2017-12-04 ENCOUNTER — Telehealth: Payer: Self-pay | Admitting: *Deleted

## 2017-12-04 NOTE — Telephone Encounter (Signed)
I called Diane Ramirez's home / mobile number and heard a message " that mailbox is invalid". I called her contact number and heard a busy signal.

## 2017-12-04 NOTE — Telephone Encounter (Signed)
-----   Message from Frederik PearJulie P Degele, MD sent at 12/01/2017 11:59 AM EDT ----- LSIL pap. Pt needs colposcopy.

## 2017-12-22 ENCOUNTER — Encounter: Payer: Self-pay | Admitting: *Deleted

## 2018-02-17 ENCOUNTER — Ambulatory Visit: Payer: Self-pay

## 2018-05-09 ENCOUNTER — Other Ambulatory Visit: Payer: Self-pay

## 2018-05-09 ENCOUNTER — Encounter (HOSPITAL_COMMUNITY): Payer: Self-pay

## 2018-05-09 ENCOUNTER — Emergency Department (HOSPITAL_COMMUNITY)
Admission: EM | Admit: 2018-05-09 | Discharge: 2018-05-09 | Disposition: A | Discharge status: Home / Self Care | Payer: Medicaid Other | Attending: Emergency Medicine | Admitting: Emergency Medicine

## 2018-05-09 DIAGNOSIS — Y998 Other external cause status: Secondary | ICD-10-CM | POA: Insufficient documentation

## 2018-05-09 DIAGNOSIS — Y93I9 Activity, other involving external motion: Secondary | ICD-10-CM | POA: Insufficient documentation

## 2018-05-09 DIAGNOSIS — S0003XA Contusion of scalp, initial encounter: Secondary | ICD-10-CM

## 2018-05-09 DIAGNOSIS — Z87891 Personal history of nicotine dependence: Secondary | ICD-10-CM | POA: Insufficient documentation

## 2018-05-09 DIAGNOSIS — Y92410 Unspecified street and highway as the place of occurrence of the external cause: Secondary | ICD-10-CM | POA: Insufficient documentation

## 2018-05-09 DIAGNOSIS — S0083XA Contusion of other part of head, initial encounter: Secondary | ICD-10-CM | POA: Insufficient documentation

## 2018-05-09 MED ORDER — IBUPROFEN 200 MG PO TABS
400.0000 mg | ORAL_TABLET | Freq: Once | ORAL | Status: AC
  Administered 2018-05-09: 400 mg via ORAL
  Filled 2018-05-09: qty 2
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
## 2018-05-09 NOTE — Discharge Instructions (Addendum)
Take tylenol and ibuprofen as needed for pain. Follow up with your doctor. Return here for worsening symptoms.
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
## 2018-06-10 NOTE — L&D Delivery Note (Addendum)
[facility] Delivery Note  Diane Ramirez is a 28 y.o. now G2P1001 s/p SVD at [redacted]w[redacted]d who was admitted for PEC, IUGR, Oligohydramnios.   ROM: 3h 33m with clear fluid GBS Status: Positive Maximum Maternal Temperature: 98.0  Delivery Note At 8:17 PM a viable female was delivered via Vaginal, Spontaneous (Presentation: Right Occiput Anterior).  APGAR: 9, 9; weight - pending.   Placenta status: Spontaneous via Diane Ramirez, Intact. Cord: 3 vessels with the following complications: None.  Cord pH: n/a  Anesthesia: Epidural Episiotomy: None Lacerations: None Suture Repair: n/a Est. Blood Loss (mL): 201  Postpartum Planning [x]  Mom to postpartum.  Baby to Couplet care / Skin to Skin.  [x]  plans to breast feed [x]  message to sent to schedule follow-up  [x]  vaccines UTD -- TdaP declined   Post-Placental IUD Insertion Procedure Note  Patient identified, informed consent signed prior to delivery, signed copy in chart, time out was performed.    Vaginal, labial and perineal areas thoroughly inspected for lacerations. Perineum intact and no repair required prior to insertion of IUD. Liletta IUD opened in sterile fashion by RN and removed from package by CNM with sterile gloved hands.   - IUD grasped between sterile gloved fingers. Sterile lubrication applied to sterile gloved hand for ease of insertion. Fundus identified through abdominal wall using non-insertion hand. IUD inserted to fundus with bimanual technique. IUD carefully released at the fundus and insertion hand gently removed from vagina.   - IUD grasped with sterile gloved hand. Fundus identified through abdominal wall using non-insertion hand. IUD inserted to fundus. IUD carefully released at the fundus and hand gently removed from vagina.   Strings trimmed to the level of the introitus. Patient tolerated procedure well.  Lot # [id number] Expiration Date 10/31/2022  Patient given post procedure instructions and IUD care card with  expiration date.  Patient is asked to keep IUD strings tucked in her vagina until her postpartum follow up visit in 4-6 weeks. Patient advised to abstain from sexual intercourse and pulling on strings before her follow-up visit. Patient verbalized an understanding of the plan of care and agrees.   Diane Deep, MSN, CNM 06/02/19, 8:52 PM

## 2018-06-26 ENCOUNTER — Encounter (HOSPITAL_COMMUNITY): Payer: Self-pay | Admitting: Emergency Medicine

## 2018-06-26 ENCOUNTER — Other Ambulatory Visit: Payer: Self-pay

## 2018-06-26 ENCOUNTER — Emergency Department (HOSPITAL_COMMUNITY)
Admission: EM | Admit: 2018-06-26 | Discharge: 2018-06-26 | Disposition: A | Discharge status: Home / Self Care | Payer: Medicaid Other | Attending: Emergency Medicine | Admitting: Emergency Medicine

## 2018-06-26 DIAGNOSIS — Z87891 Personal history of nicotine dependence: Secondary | ICD-10-CM | POA: Insufficient documentation

## 2018-06-26 DIAGNOSIS — Z79899 Other long term (current) drug therapy: Secondary | ICD-10-CM | POA: Insufficient documentation

## 2018-06-26 DIAGNOSIS — N750 Cyst of Bartholin's gland: Secondary | ICD-10-CM | POA: Insufficient documentation

## 2018-06-26 LAB — POC URINE PREG, ED: Preg Test, Ur: NEGATIVE

## 2018-06-26 LAB — URINALYSIS, ROUTINE W REFLEX MICROSCOPIC
BILIRUBIN URINE: NEGATIVE
Glucose, UA: NEGATIVE mg/dL
KETONES UR: NEGATIVE mg/dL
LEUKOCYTES UA: NEGATIVE
Nitrite: NEGATIVE
PROTEIN: NEGATIVE mg/dL
Specific Gravity, Urine: 1.012 (ref 1.005–1.030)
pH: 7 (ref 5.0–8.0)

## 2018-06-26 MED ORDER — DOXYCYCLINE HYCLATE 100 MG PO CAPS: 100.0000 mg | ORAL_CAPSULE | Freq: Two times a day (BID) | ORAL | 0 refills | Status: DC

## 2018-06-26 MED ORDER — TETANUS-DIPHTH-ACELL PERTUSSIS 5-2.5-18.5 LF-MCG/0.5 IM SUSP
0.5000 mL | Freq: Once | INTRAMUSCULAR | Status: DC
  Filled 2018-06-26: qty 0.5

## 2018-06-26 MED ORDER — TRAMADOL HCL 50 MG PO TABS: 50.0000 mg | ORAL_TABLET | Freq: Four times a day (QID) | ORAL | 0 refills | Status: DC | PRN

## 2018-06-26 MED ORDER — LIDOCAINE-EPINEPHRINE 2 %-1:100000 IJ SOLN
20.0000 mL | Freq: Once | INTRAMUSCULAR | Status: DC
  Filled 2018-06-26: qty 20

## 2018-06-26 NOTE — ED Provider Notes (Signed)
[facility]   CSN: [id number] Arrival date & time: 06/26/18  [id number]     History   Chief Complaint Chief Complaint  Patient presents with  . Vaginal Abscess    HPI Diane Ramirez is a 28 y.o. female.  The history is provided by the patient. No language interpreter was used.     28 year old female complaining of vaginal abscess.  Patient report she discovered a nodule on her right labial region while giving birth to her son approximately 7 months ago.  At that time it did not cause any significant pain and subsequently resolved without any specific treatment.  However since yesterday she noticed swelling and tenderness to the same location.  Pain is sharp, moderate in severity, worsening with palpation.  There is no associated fever or chills no dysuria, no discharge or rash.  She denies any specific injury.  She is unable to recall last tetanus status.  She is currently not receiving.  No specific treatment tried.  Past Medical History:  Diagnosis Date  . Medical history non-contributory     Patient Active Problem List   Diagnosis Date Noted  . Labor and delivery indication for care or intervention 10/30/2017    Past Surgical History:  Procedure Laterality Date  . NO PAST SURGERIES       OB History    Gravida  1   Para  1   Term  1   Preterm  0   AB  0   Living  1     SAB  0   TAB  0   Ectopic  0   Multiple  0   Live Births  1            Home Medications    Prior to Admission medications   Medication Sig Start Date End Date Taking? Authorizing Provider  ibuprofen (ADVIL,MOTRIN) 600 MG tablet Take 1 tablet (600 mg total) by mouth every 6 (six) hours. 11/02/17   Hermina StaggersErvin, Michael L, MD  Prenatal Vit-Fe Fumarate-FA (MULTIVITAMIN-PRENATAL) 27-0.8 MG TABS tablet Take 1 tablet by mouth daily at 12 noon. 05/10/17   [provider]  senna-docusate (SENOKOT-S) 8.6-50 MG tablet Take 2 tablets by mouth  daily. 11/03/17   Hermina StaggersErvin, Michael L, MD    Family History Family History  Problem Relation Age of Onset  . Cancer Maternal Grandmother   . Cancer Paternal Grandmother     Social History Social History   Tobacco Use  . Smoking status: Former Smoker    Packs/day: 0.25    Years: 1.00    Pack years: 0.25    Types: Cigars    Last attempt to quit: 08/30/2017    Years since quitting: 0.8  . Smokeless tobacco: Never Used  Substance Use Topics  . Alcohol use: Not Currently  . Drug use: Not Currently    Types: Marijuana     Allergies   Penicillins   Review of Systems Review of Systems  Constitutional: Negative for fever.  Genitourinary: Positive for genital sores and vaginal pain. Negative for vaginal bleeding and vaginal discharge.     Physical Exam Updated Vital Signs BP (!) 157/104 (BP Location: Right Arm)   Pulse 97   Temp 98.4 F (36.9 C) (Oral)   Resp 17   Ht 5\' 5"  (1.651 m)   Wt 55.8 kg   SpO2 100%   BMI 20.47 kg/m   Physical Exam Vitals signs and nursing note reviewed. Exam conducted  with a chaperone present.  Constitutional:      General: She is not in acute distress.    Appearance: She is well-developed.  HENT:     Head: Atraumatic.  Eyes:     Conjunctiva/sclera: Conjunctivae normal.  Neck:     Musculoskeletal: Neck supple.  Genitourinary:    Comments: Significant induration and fluctuant noted to the right labial region consistent with an infected Bartholin cyst.  It is tender to palpation.  Minimal surrounding erythema.  Perineum is soft. Skin:    Findings: No rash.  Neurological:     Mental Status: She is alert.      ED Treatments / Results  Labs (all labs ordered are listed, but only abnormal results are displayed) Labs Reviewed  URINALYSIS, ROUTINE W REFLEX MICROSCOPIC - Abnormal; Notable for the following components:      Result Value   Hgb urine dipstick LARGE (*)    Bacteria, UA RARE (*)    All other components within normal  limits  POC URINE PREG, ED  pt started her menstruation.  EKG None  Radiology No results found.  Procedures .Marland Kitchen.Incision and Drainage Date/Time: 06/26/2018 11:51 AM Performed by: Fayrene Helperran, Roshonda Sperl, PA-C Authorized by: Fayrene Helperran, Adalis Gatti, PA-C   Consent:    Consent obtained:  Verbal   Consent given by:  Patient   Risks discussed:  Bleeding, incomplete drainage, pain and damage to other organs   Alternatives discussed:  No treatment Universal protocol:    Procedure explained and questions answered to patient or proxy's satisfaction: yes     Relevant documents present and verified: yes     Test results available and properly labeled: yes     Imaging studies available: yes     Required blood products, implants, devices, and special equipment available: yes     Site/side marked: yes     Immediately prior to procedure a time out was called: yes     Patient identity confirmed:  Verbally with patient Location:    Type:  Bartholin cyst   Size:  5cm   Location:  Anogenital   Anogenital location:  Bartholin's gland Pre-procedure details:    Skin preparation:  Betadine Anesthesia (see MAR for exact dosages):    Anesthesia method:  Local infiltration   Local anesthetic:  Lidocaine 1% WITH epi Procedure type:    Complexity:  Complex Procedure details:    Incision types:  Single straight   Incision depth:  Subcutaneous   Scalpel blade:  11   Wound management:  Probed and deloculated, irrigated with saline and extensive cleaning   Drainage:  Purulent   Drainage amount:  Copious   Wound treatment:  Drain placed   Packing materials:  Word catheter Post-procedure details:    Patient tolerance of procedure:  Tolerated well, no immediate complications   (including critical care time)  Medications Ordered in ED Medications  lidocaine-EPINEPHrine (XYLOCAINE W/EPI) 2 %-1:100000 (with pres) injection 20 mL (has no administration in time range)  Tdap (BOOSTRIX) injection 0.5 mL (has no  administration in time range)     Initial Impression / Assessment and Plan / ED Course  I have reviewed the triage vital signs and the nursing notes.  Pertinent labs & imaging results that were available during my care of the patient were reviewed by me and considered in my medical decision making (see chart for details).     BP (!) 157/104 (BP Location: Right Arm)   Pulse 97   Temp 98.4 F (36.9 C) (Oral)  Resp 17   Ht 5\' 5"  (1.651 m)   Wt 55.8 kg   SpO2 100%   BMI 20.47 kg/m    Final Clinical Impressions(s) / ED Diagnoses   Final diagnoses:  Infected cyst of Bartholin's gland duct    ED Discharge Orders    None     11:53 AM Pt had a large infected Bartholin's cyst.  I performed an incision and drainage and was able to evacuate a large amount of purulent discharge.  Word catheter placed, encourage patient to follow-up with PCP in the next few days for recheck.  Patient discharged home with pain medication and antibiotic.  Return precautions discussed.   Fayrene Helper, PA-C 06/26/18 1233    Derwood Kaplan, MD 06/27/18 1126

## 2018-06-26 NOTE — ED Triage Notes (Signed)
Pt arrives to ED from home with complaints of a vaginal abscess that showed up yesterday. Pt reports she has had this once before. Pt denies any vaginal discharge or urinary symptoms. Pt placed in position of comfort with bed locked and lowered, call bell in reach.

## 2018-06-26 NOTE — Discharge Instructions (Addendum)
Please follow up with your doctor in 48 hours for recheck of your abscess.

## 2018-12-22 ENCOUNTER — Telehealth: Payer: Self-pay | Admitting: Obstetrics and Gynecology

## 2018-12-22 NOTE — Telephone Encounter (Signed)
The patient called in to verify information for appointment. Completed the pre-screen. The patient answered no to COVID19 symptoms and/or being previously diagnosed. Informed the patient of the wearing a face mask, sanitizing hands at the sanitizing station upon entering our office, and no visitors or children are allowed due to the COVID19 restrictions. The patient verbalized understanding. °

## 2018-12-24 ENCOUNTER — Ambulatory Visit: Payer: Medicaid Other

## 2019-01-19 ENCOUNTER — Ambulatory Visit (INDEPENDENT_AMBULATORY_CARE_PROVIDER_SITE_OTHER): Payer: Self-pay | Admitting: *Deleted

## 2019-01-19 ENCOUNTER — Encounter: Payer: Self-pay | Admitting: Obstetrics and Gynecology

## 2019-01-19 ENCOUNTER — Ambulatory Visit (INDEPENDENT_AMBULATORY_CARE_PROVIDER_SITE_OTHER): Payer: Medicaid Other

## 2019-01-19 ENCOUNTER — Other Ambulatory Visit: Payer: Self-pay

## 2019-01-19 DIAGNOSIS — Z3201 Encounter for pregnancy test, result positive: Secondary | ICD-10-CM | POA: Diagnosis present

## 2019-01-19 DIAGNOSIS — Z3480 Encounter for supervision of other normal pregnancy, unspecified trimester: Secondary | ICD-10-CM | POA: Insufficient documentation

## 2019-01-19 DIAGNOSIS — O0932 Supervision of pregnancy with insufficient antenatal care, second trimester: Secondary | ICD-10-CM | POA: Insufficient documentation

## 2019-01-19 LAB — POCT PREGNANCY, URINE: Preg Test, Ur: POSITIVE — AB

## 2019-01-19 NOTE — Progress Notes (Signed)
Agree with A & P. 

## 2019-01-19 NOTE — Progress Notes (Signed)
Pt here today for pregnancy test resulted positive.  Pt reports sure LMP 09/13/18.  Based on LMP pt's EDD is 06/20/19 18w 2d today.  Pt denies any pain or bleeding.  Pt reports that she was not planning on keeping the baby but her family was so upset about the decision she made that she has to accept it the pregnancy.  Medications reconciled. [place] office to provide proof of pregnancy letter.    Mel Almond, RN 01/19/19

## 2019-01-19 NOTE — Progress Notes (Signed)
    PRENATAL INTAKE SUMMARY  Ms. Reader presents today New OB Nurse Interview.  OB History    Gravida  2   Para  1   Term  1   Preterm  0   AB  0   Living  1     SAB  0   TAB  0   Ectopic  0   Multiple  0   Live Births  1          I have reviewed the patient's medical, obstetrical, social, and family histories, medications, and available lab results.  SUBJECTIVE She has no unusual complaints.  OBJECTIVE Initial Physical Exam (New OB).  LATE TO CARE.  EDD: 06/20/2019 by LMP GA:[redacted]w[redacted]d [id number] FHT: 164  GENERAL APPEARANCE: alert, well appearing, in no apparent distress, oriented to person, place and time.   ASSESSMENT Normal pregnancy  PLAN Prenatal care-[facility] Leonie Man Pnl/HIV  OB Urine Culture GC/CT/PAP at next visit with midwife HgbEval/SMA/CF ([facility]) Panorama A1C Glucose  If CHTN - P/C Ratio and CMP Continue PNV U/S ordered +14 weeks  Derl Barrow, RN

## 2019-01-20 ENCOUNTER — Other Ambulatory Visit (HOSPITAL_COMMUNITY)
Admission: RE | Admit: 2019-01-20 | Discharge: 2019-01-20 | Disposition: A | Discharge status: Home / Self Care | Payer: Medicaid Other | Source: Ambulatory Visit

## 2019-01-20 ENCOUNTER — Ambulatory Visit (INDEPENDENT_AMBULATORY_CARE_PROVIDER_SITE_OTHER): Payer: Medicaid Other

## 2019-01-20 ENCOUNTER — Encounter: Payer: Self-pay | Admitting: General Practice

## 2019-01-20 VITALS — BP 113/68 | HR 78 | Temp 98.3°F | Wt 132.2 lb

## 2019-01-20 DIAGNOSIS — Z3480 Encounter for supervision of other normal pregnancy, unspecified trimester: Secondary | ICD-10-CM | POA: Diagnosis present

## 2019-01-20 DIAGNOSIS — O0932 Supervision of pregnancy with insufficient antenatal care, second trimester: Secondary | ICD-10-CM

## 2019-01-20 DIAGNOSIS — O99012 Anemia complicating pregnancy, second trimester: Secondary | ICD-10-CM | POA: Diagnosis not present

## 2019-01-20 DIAGNOSIS — Z124 Encounter for screening for malignant neoplasm of cervix: Secondary | ICD-10-CM

## 2019-01-20 DIAGNOSIS — Z3A18 18 weeks gestation of pregnancy: Secondary | ICD-10-CM | POA: Diagnosis not present

## 2019-01-20 DIAGNOSIS — O23592 Infection of other part of genital tract in pregnancy, second trimester: Secondary | ICD-10-CM

## 2019-01-20 DIAGNOSIS — O09292 Supervision of pregnancy with other poor reproductive or obstetric history, second trimester: Secondary | ICD-10-CM | POA: Insufficient documentation

## 2019-01-20 DIAGNOSIS — N760 Acute vaginitis: Secondary | ICD-10-CM

## 2019-01-20 DIAGNOSIS — B9689 Other specified bacterial agents as the cause of diseases classified elsewhere: Secondary | ICD-10-CM

## 2019-01-20 DIAGNOSIS — O99019 Anemia complicating pregnancy, unspecified trimester: Secondary | ICD-10-CM | POA: Insufficient documentation

## 2019-01-20 DIAGNOSIS — O0930 Supervision of pregnancy with insufficient antenatal care, unspecified trimester: Secondary | ICD-10-CM

## 2019-01-20 LAB — COMPREHENSIVE METABOLIC PANEL
ALT: 5 IU/L (ref 0–32)
AST: 8 IU/L (ref 0–40)
Albumin/Globulin Ratio: 1.7 (ref 1.2–2.2)
Albumin: 3.8 g/dL — ABNORMAL LOW (ref 3.9–5.0)
Alkaline Phosphatase: 61 IU/L (ref 39–117)
BUN/Creatinine Ratio: 22 (ref 9–23)
BUN: 9 mg/dL (ref 6–20)
Bilirubin Total: <0.2 mg/dL (ref 0.0–1.2)
CO2: 21 mmol/L (ref 20–29)
Calcium: 8.6 mg/dL — ABNORMAL LOW (ref 8.7–10.2)
Chloride: 104 mmol/L (ref 96–106)
Creatinine, Ser: 0.41 mg/dL — ABNORMAL LOW (ref 0.57–1.00)
GFR calc Af Amer: 166 mL/min/{1.73_m2} (ref >59)
GFR calc non Af Amer: 144 mL/min/{1.73_m2} (ref >59)
Globulin, Total: 2.3 g/dL (ref 1.5–4.5)
Glucose: 80 mg/dL (ref 65–99)
Potassium: 4 mmol/L (ref 3.5–5.2)
Sodium: 137 mmol/L (ref 134–144)
Total Protein: 6.1 g/dL (ref 6.0–8.5)

## 2019-01-20 LAB — OBSTETRIC PANEL, INCLUDING HIV
Antibody Screen: NEGATIVE
Basophils Absolute: 0 10*3/uL (ref 0.0–0.2)
Basos: 1 %
EOS (ABSOLUTE): 0.1 10*3/uL (ref 0.0–0.4)
Eos: 2 %
HIV Screen 4th Generation wRfx: NONREACTIVE
Hematocrit: 32 % — ABNORMAL LOW (ref 34.0–46.6)
Hemoglobin: 10.6 g/dL — ABNORMAL LOW (ref 11.1–15.9)
Hepatitis B Surface Ag: NEGATIVE
Immature Grans (Abs): 0 10*3/uL (ref 0.0–0.1)
Immature Granulocytes: 0 %
Lymphocytes Absolute: 2.3 10*3/uL (ref 0.7–3.1)
Lymphs: 38 %
MCH: 27.5 pg (ref 26.6–33.0)
MCHC: 33.1 g/dL (ref 31.5–35.7)
MCV: 83 fL (ref 79–97)
Monocytes Absolute: 0.4 10*3/uL (ref 0.1–0.9)
Monocytes: 6 %
Neutrophils Absolute: 3.3 10*3/uL (ref 1.4–7.0)
Neutrophils: 53 %
Platelets: 207 10*3/uL (ref 150–450)
RBC: 3.86 x10E6/uL (ref 3.77–5.28)
RDW: 14.8 % (ref 11.7–15.4)
RPR Ser Ql: NONREACTIVE
Rh Factor: POSITIVE
Rubella Antibodies, IGG: 1.08 index (ref >0.99)
WBC: 6.1 10*3/uL (ref 3.4–10.8)

## 2019-01-20 LAB — PROTEIN / CREATININE RATIO, URINE
Creatinine, Urine: 117.1 mg/dL
Protein, Ur: 19.9 mg/dL
Protein/Creat Ratio: 170 mg/g creat (ref 0–200)

## 2019-01-20 LAB — GLUCOSE, RANDOM: Glucose: 80 mg/dL (ref 65–99)

## 2019-01-20 LAB — HEMOGLOBIN A1C
Est. average glucose Bld gHb Est-mCnc: 108 mg/dL
Hgb A1c MFr Bld: 5.4 % (ref 4.8–5.6)

## 2019-01-20 MED ORDER — FERROUS SULFATE 325 (65 FE) MG PO TBEC: 325.0000 mg | DELAYED_RELEASE_TABLET | Freq: Every day | ORAL | 3 refills | Status: DC

## 2019-01-20 NOTE — Progress Notes (Addendum)
Subjective:   Diane Ramirez is a 28 y.o. G2P1001 at 5261w3d by LMP being seen today for her first obstetrical visit.  Her obstetrical history is significant for pre-eclampsia in previous pregnancy. Patient does intend to breast feed. Pregnancy history fully reviewed.  Patient reports no complaints.  Patient expresses desire for BTL stating that she does not desire any more children.  Last delivery May 2019.  States she took depo in the past and had significant weight loss, but otherwise no issues.   HISTORY: OB History  Gravida Para Term Preterm AB Living  2 1 1  0 0 1  SAB TAB Ectopic Multiple Live Births  0 0 0 0 1    # Outcome Date GA Lbr Len/2nd Weight Sex Delivery Anes PTL Lv  2 Current           1 Term 10/31/17 2379w1d 06:05 / 00:09 5 lb 10.7 oz (2.57 kg) M Vag-Spont EPI  LIV     Name: Lenore MannerCANTY,BOY Oletta     Apgar1: 1  Apgar5: 4    Last pap smear was done June 2019 and was abnormal - LSIL, but patient never completed her Colposcopy.  Past Medical History:  Diagnosis Date  . Medical history non-contributory    Past Surgical History:  Procedure Laterality Date  . NO PAST SURGERIES     Family History  Problem Relation Age of Onset  . Cancer Maternal Grandmother   . Cancer Paternal Grandmother    Social History   Tobacco Use  . Smoking status: Former Smoker    Packs/day: 0.25    Years: 1.00    Pack years: 0.25    Types: Cigars    Quit date: 08/30/2017    Years since quitting: 1.3  . Smokeless tobacco: Never Used  Substance Use Topics  . Alcohol use: Not Currently  . Drug use: Not Currently    Types: Marijuana   Allergies  Allergen Reactions  . Penicillins Swelling    Has patient had a PCN reaction causing immediate rash, facial/tongue/throat swelling, SOB or lightheadedness with hypotension: yes Has patient had a PCN reaction causing severe rash involving mucus membranes or skin necrosis: no Has patient had a PCN reaction that required hospitalization:no Has  patient had a PCN reaction occurring within the last 10 years: {no If all of the above answers are "NO", then may proceed with Cephalosporin use.    Current Outpatient Medications on File Prior to Visit  Medication Sig Dispense Refill  . Prenatal Vit-Fe Fumarate-FA (MULTIVITAMIN-PRENATAL) 27-0.8 MG TABS tablet Take 1 tablet by mouth daily at 12 noon.     No current facility-administered medications on file prior to visit.     Review of Systems Pertinent items noted in HPI and remainder of comprehensive ROS otherwise negative.  Exam   Vitals:   01/20/19 1039  BP: 113/68  Pulse: 78  Temp: 98.3 F (36.8 C)  Weight: 132 lb 3.2 oz (60 kg)   Fetal Heart Rate (bpm): 155  Physical Exam Exam conducted with a chaperone present.  Constitutional:      Appearance: Normal appearance.  HENT:     Head: Normocephalic and atraumatic.  Eyes:     Conjunctiva/sclera: Conjunctivae normal.  Neck:     Musculoskeletal: Normal range of motion.  Cardiovascular:     Rate and Rhythm: Normal rate and regular rhythm.     Pulses: Normal pulses.     Heart sounds: Normal heart sounds.  Pulmonary:  Effort: Pulmonary effort is normal.     Breath sounds: Normal breath sounds.  Abdominal:     General: Bowel sounds are normal.     Palpations: Abdomen is soft.     Comments: Fundus U/-2, Nontender  Genitourinary:    General: Normal vulva.     Labia:        Right: No rash or tenderness.        Left: No rash or tenderness.      Vagina: Vaginal discharge present.     Cervix: Discharge present. No cervical motion tenderness, friability or cervical bleeding.     Uterus: Enlarged (Gravid ~16-18 weeks).      Comments: Speculum Exam: -Normal External Genitalia: Non tender, no apparent discharge at introitus.  -Vaginal Vault: Pink mucosa with good rugae. Moderate amt white frothy discharge  -Cervix:Pink, no lesions, cysts, or polyps.  Appears closed. No active bleeding from os-Pap smear collected via  spatula and brush. CV sample collected. -Bimanual Exam:  No tenderness in [place]  Skin:    General: Skin is warm and dry.  Neurological:     Mental Status: She is alert.  Psychiatric:        Mood and Affect: Mood normal.        Thought Content: Thought content normal.      155 by Doppler Assessment:   Pregnancy: G2P1001 Patient Active Problem List   Diagnosis Date Noted  . Supervision of other normal pregnancy, antepartum 01/19/2019  . Labor and delivery indication for care or intervention 10/30/2017     Plan:  1. Supervision of other normal pregnancy, antepartum -Congratulations given and patient welcomed to practice. -Discussed virtual visits as primary source of PN visits in midst of coronavirus.   -Encouraged to seek out care at office or emergency room for urgent and/or emergent concerns. -Educated on the nature of [place] with multiple MDs and other Advanced Practice Providers was explained to patient; also emphasized that residents, students are part of our team. Informed of her right to refuse care as she deems appropriate.  -No questions or concerns.  -Anticipatory guidance for prenatal visits including labs, ultrasounds, and testing. -Reviewed most recent labs. -Informed of where to report for pregnancy related issues-[facility] and other issues [facility]. -Encouraged to complete MyChart Registration for her ability to review results, send requests, and have questions addressed.  -Discussed due date based Jun 20, 2019. -Continue prenatal vitamins.  -Plans to breastfeed, but is unsure of what she will utilize for [facility] BC method. -Desires circumcision if a female child.   2. Pap smear for cervical cancer screening -Discussed history of abnormal pap smear. Collected and sent.  - Cytology - PAP( [facility]) - Cervicovaginal ancillary only( [place])  3. Anemia affecting pregnancy, antepartum  -Instructed to increase iron in diet.  -Rx for Ferrous sulfate sent to pharmacy on file -Informed that repeat testing will occur at 28 weeks  4. Late prenatal care -Care initiated at 18 weeks -Patient discloses she considered TAB  5. Hx of preeclampsia, prior pregnancy, currently pregnant, second trimester -Baseline labs drawn  Genetic Screening discussed, First trimester screen, Quad screen and NIPS: ordered. Ultrasound discussed; fetal anatomic survey: and scheduled for Jan 28, 2019. Problem list reviewed and updated. Routine obstetric precautions reviewed. Return in about 4 weeks (around 02/17/2019) for LR-ROB.   Maryann Conners, CNM 01/20/2019 10:50 AM

## 2019-01-21 ENCOUNTER — Encounter: Payer: Self-pay | Admitting: General Practice

## 2019-01-21 LAB — AFP TETRA
DIA Mom Value: 2
DIA Value (EIA): 363.97 pg/mL
DSR (By Age)    1 IN: 966
DSR (Second Trimester) 1 IN: 2518
Gestational Age: 18 WEEKS
MSAFP Mom: 1.7
MSAFP: 87.1 ng/mL
MSHCG Mom: 0.68
MSHCG: 22008 m[IU]/mL
Maternal Age At EDD: 26.3 yr
Osb Risk: 3260
T18 (By Age): 1:3762 {titer}
Test Results:: NEGATIVE
Weight: 131 [lb_av]
uE3 Mom: 0.64
uE3 Value: 0.93 ng/mL

## 2019-01-22 DIAGNOSIS — N879 Dysplasia of cervix uteri, unspecified: Secondary | ICD-10-CM | POA: Insufficient documentation

## 2019-01-22 LAB — CYTOLOGY - PAP

## 2019-01-22 LAB — CERVICOVAGINAL ANCILLARY ONLY
Bacterial vaginitis: POSITIVE — AB
Candida vaginitis: NEGATIVE
Chlamydia: NEGATIVE
Neisseria Gonorrhea: NEGATIVE
Trichomonas: NEGATIVE

## 2019-01-23 ENCOUNTER — Other Ambulatory Visit: Payer: Self-pay | Admitting: Obstetrics and Gynecology

## 2019-01-23 MED ORDER — METRONIDAZOLE 500 MG PO TABS: 500.0000 mg | ORAL_TABLET | Freq: Two times a day (BID) | ORAL | 0 refills | Status: DC

## 2019-01-23 NOTE — Addendum Note (Signed)
Addended by: Gavin Pound L on: 01/23/2019 06:03 AM   Modules accepted: Orders

## 2019-01-23 NOTE — Progress Notes (Signed)
Hello Diane Ramirez,  Please call and inform the patient of her pap smear results.  She will need to be scheduled with a doctor for a colposcopy.  Also inform her of there BV infection.  I have sent her a script for metronidazole to the pharmacy on file.   Thanks, JE

## 2019-01-25 ENCOUNTER — Telehealth: Payer: Self-pay | Admitting: *Deleted

## 2019-01-25 ENCOUNTER — Encounter: Payer: Self-pay | Admitting: General Practice

## 2019-01-25 ENCOUNTER — Other Ambulatory Visit: Payer: Self-pay | Admitting: Obstetrics and Gynecology

## 2019-01-25 DIAGNOSIS — R8271 Bacteriuria: Secondary | ICD-10-CM

## 2019-01-25 NOTE — Telephone Encounter (Signed)
-----   Message from Gavin Pound, [place] sent at 01/23/2019  6:02 AM EDT ----- Ranae Pila,  Please call and inform the patient of her pap smear results.  She will need to be scheduled with a doctor for a colposcopy.  Also inform her of there BV infection.  I have sent her a script for metronidazole to the pharmacy on file.   Thanks, JE

## 2019-01-25 NOTE — Telephone Encounter (Signed)
Patient was called regarding abnormal PAP results of LSIL and need for colpo. Explain beliefly what results of pap and what colpo was. Pt also advised on need to take metronidazole for BV. Pt will receive a call regarding colpo appointment.  Derl Barrow, RN

## 2019-01-26 LAB — CULTURE, OB URINE

## 2019-01-26 LAB — URINE CULTURE, OB REFLEX

## 2019-01-27 ENCOUNTER — Encounter: Payer: Self-pay | Admitting: General Practice

## 2019-01-28 ENCOUNTER — Other Ambulatory Visit: Payer: Self-pay | Admitting: Obstetrics and Gynecology

## 2019-01-28 ENCOUNTER — Ambulatory Visit (HOSPITAL_COMMUNITY): Payer: Medicaid Other

## 2019-01-28 ENCOUNTER — Other Ambulatory Visit: Payer: Self-pay

## 2019-01-28 ENCOUNTER — Ambulatory Visit (HOSPITAL_COMMUNITY)
Admission: RE | Admit: 2019-01-28 | Discharge: 2019-01-28 | Disposition: A | Discharge status: Home / Self Care | Payer: Medicaid Other | Source: Ambulatory Visit | Attending: Obstetrics and Gynecology | Admitting: Obstetrics and Gynecology

## 2019-01-28 ENCOUNTER — Other Ambulatory Visit (HOSPITAL_COMMUNITY): Payer: Self-pay | Admitting: *Deleted

## 2019-01-28 ENCOUNTER — Telehealth: Payer: Self-pay

## 2019-01-28 DIAGNOSIS — O289 Unspecified abnormal findings on antenatal screening of mother: Secondary | ICD-10-CM | POA: Diagnosis not present

## 2019-01-28 DIAGNOSIS — O09292 Supervision of pregnancy with other poor reproductive or obstetric history, second trimester: Secondary | ICD-10-CM | POA: Diagnosis not present

## 2019-01-28 DIAGNOSIS — Z880 Allergy status to penicillin: Secondary | ICD-10-CM | POA: Insufficient documentation

## 2019-01-28 DIAGNOSIS — O99012 Anemia complicating pregnancy, second trimester: Secondary | ICD-10-CM

## 2019-01-28 DIAGNOSIS — Z3480 Encounter for supervision of other normal pregnancy, unspecified trimester: Secondary | ICD-10-CM

## 2019-01-28 DIAGNOSIS — O4442 Low lying placenta NOS or without hemorrhage, second trimester: Secondary | ICD-10-CM

## 2019-01-28 DIAGNOSIS — Z363 Encounter for antenatal screening for malformations: Secondary | ICD-10-CM

## 2019-01-28 DIAGNOSIS — Z362 Encounter for other antenatal screening follow-up: Secondary | ICD-10-CM

## 2019-01-28 DIAGNOSIS — R8271 Bacteriuria: Secondary | ICD-10-CM

## 2019-01-28 DIAGNOSIS — Z3A19 19 weeks gestation of pregnancy: Secondary | ICD-10-CM

## 2019-01-28 NOTE — Telephone Encounter (Signed)
Diane Ramirez 06/24/1990 [id number]   Patient called and verified her identity via birth date and current address.  Patient reports that SSN on file is incorrect and informed that she may be able to modify it online via mychart or will have to do so at her next office visit.  Patient verbalized understanding.    Patient agreeable to results via phone and was informed of abnormal urine results.  Extensive discussion regarding GBS, need for treatment, and how PCN allergy hinders treatment.  Patient states that she was told by her mother about this allergy and that her reaction is hives, rash, and swelling.  Patient states she does not recall the age of her reaction and has not had PCN.  Patient states she has never had allergy testing for PCN.  Patient encouraged to discuss allergy further with mother and contact provider with further information.   Dr. Rip Harbour consulted and informed of 50k GBS bacteriuria, PCN allergy, and clindamycin resistance. Advised following UTD recommendation which states formal allergy testing is necessary. Office staff contacted; allergy referral placed.   Maryann Conners MSN, CNM  01/28/2019 9:00 AM

## 2019-01-29 ENCOUNTER — Other Ambulatory Visit: Payer: Self-pay | Admitting: Obstetrics and Gynecology

## 2019-01-29 DIAGNOSIS — O09292 Supervision of pregnancy with other poor reproductive or obstetric history, second trimester: Secondary | ICD-10-CM

## 2019-01-29 MED ORDER — ASPIRIN EC 81 MG PO TBEC: 81.0000 mg | DELAYED_RELEASE_TABLET | Freq: Every day | ORAL | 4 refills | Status: DC

## 2019-01-29 NOTE — Progress Notes (Signed)
Rx sent after lab results review  Laury Deep, CNM

## 2019-01-31 ENCOUNTER — Encounter (HOSPITAL_COMMUNITY): Payer: Self-pay

## 2019-02-09 IMAGING — US US MFM FETAL BPP W/O NON-STRESS
1 series · 13 of 28 positions shown · non-contrast
Comparison: none

Indications

40 weeks gestation of pregnancy
Late to prenatal care, third trimester
Uterine size-date discrepancy, third trimester
Encounter for antenatal screening for
malformations
Tobacco use complicating pregnancy, third
trimester
Maternal care for known or suspected poor
fetal growth, third trimester, not applicable or
unspecified
OB History
Blood Type:            Height:  5'4"   Weight (lb):  164       BMI:
Gravidity:    1
Fetal Evaluation
Num Of Fetuses:     1
Fetal Heart         165
Rate(bpm):
Cardiac Activity:   Observed
Presentation:       Cephalic
Placenta:           Posterior, above cervical os
P. Cord Insertion:  Not well visualized
Amniotic Fluid
AFI FV:      Subjectively within normal limits
AFI Sum(cm)     %Tile       Largest Pocket(cm)
14.2            59
RUQ(cm)       RLQ(cm)       LUQ(cm)        LLQ(cm)
2.64
Biophysical Evaluation
Amniotic F.V:   Pocket => 2 cm two         F. Tone:        Observed
planes
F. Movement:    Observed                   N.S.T:          Reactive
F. Breathing:   Not Observed               Score:          [DATE]
Biometry
BPD:      87.3  mm     G. Age:  35w 2d          1  %    CI:        79.12   %    70 - 86
FL/HC:      22.2   %    20.7 -
HC:      310.3  mm     G. Age:  34w 4d        < 3  %    HC/AC:      0.98        0.87 -
AC:      315.4  mm     G. Age:  35w 3d        < 3  %    FL/BPD:     78.9   %    71 - 87
FL:       68.9  mm     G. Age:  35w 2d        < 3  %    FL/AC:      21.8   %    20 - 24
HUM:      59.5  mm     G. Age:  34w 4d        < 5  %
Est. FW:    4558  gm    5 lb 14 oz    < 10  %
Gestational Age
LMP:           40w 0d        Date:  01/23/17                 EDD:   10/30/17
U/S Today:     35w 1d                                        EDD:   12/03/17
Best:          40w 0d     Det. By:  LMP  (01/23/17)          EDD:   10/30/17
Anatomy
Cranium:               Appears normal         Aortic Arch:            Appears normal
Cavum:                 Appears normal         Ductal Arch:            Not well visualized
Ventricles:            Appears normal         Diaphragm:              Not well visualized
Choroid Plexus:        Not well visualized    Stomach:                Appears normal, left
sided
Cerebellum:            Not well visualized    Abdomen:                Appears normal
Posterior Fossa:       Not well visualized    Abdominal Wall:         Appears nml (cord
insert, abd wall)
Nuchal Fold:           Not applicable (>20    Cord Vessels:           Appears normal (3
wks GA)                                        vessel cord)
Face:                  Appears normal         Kidneys:                Appear normal
(orbits and profile)
Lips:                  Appears normal         Bladder:                Appears normal
Thoracic:              Appears normal         Spine:                  Limited views
appear normal
Heart:                 Not well visualized    Upper Extremities:      Not well visualized
RVOT:                  Appears normal         Lower Extremities:      Not well visualized
LVOT:                  Appears normal
Other:  Male gender. Technically difficult due to advanced GA and fetal
position.
Doppler - Fetal Vessels
Umbilical Artery
S/D     %tile     RI              PI              PSV    ADFV    RDFV
(cm/s)
3.66       95   0.73             1.29              42.4      No      No
Cervix Uterus Adnexa
Cervix
Not visualized (advanced GA >13wks)
Impression
INDICATION: 26 yr old G1P0 at 35w5d with late prenatal care for
fetal anatomic survey.

[Series 1: us mfm fetal bpp w/o non-stress · 175 acquisitions, 13 frames shown]
[im 7/175]
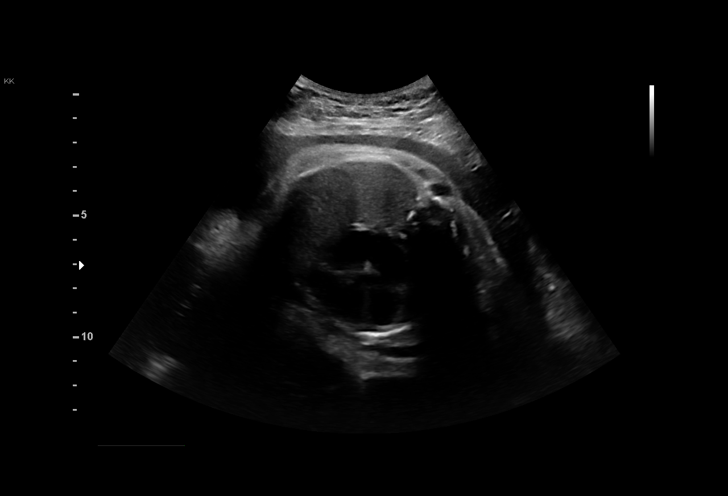
[im 20/175]
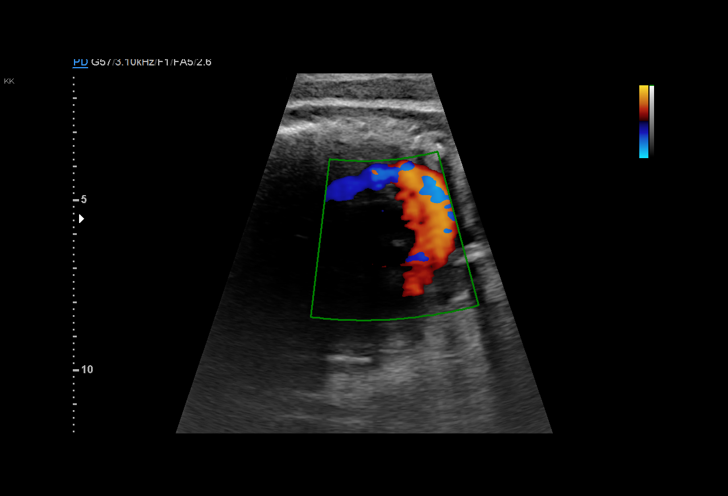
[im 33/175]
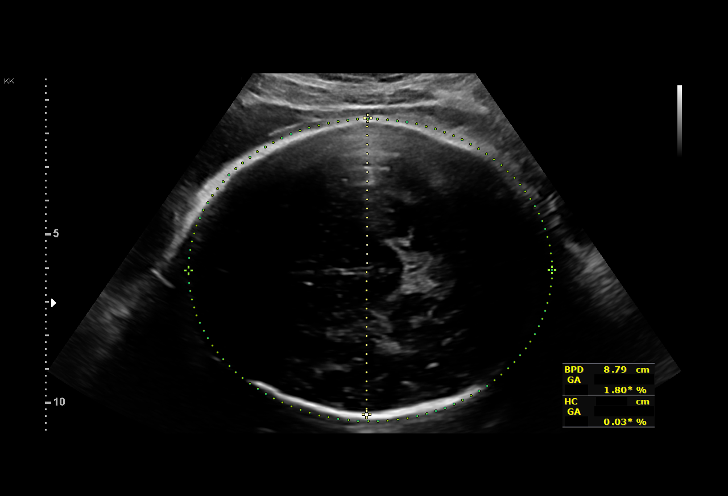
[im 46/175]
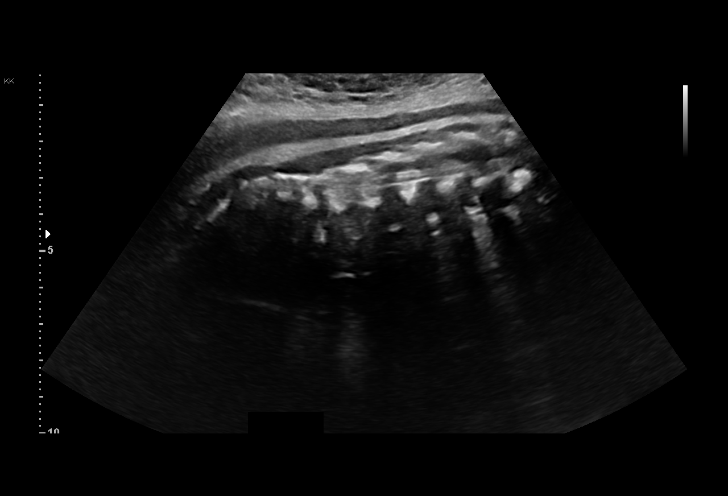
[im 59/175]
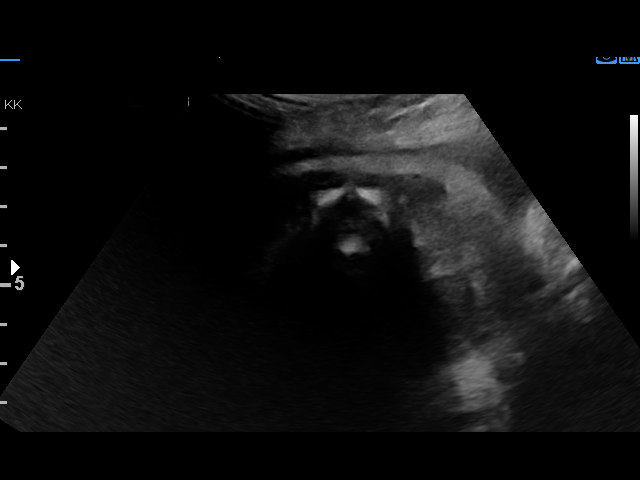
[im 71/175]
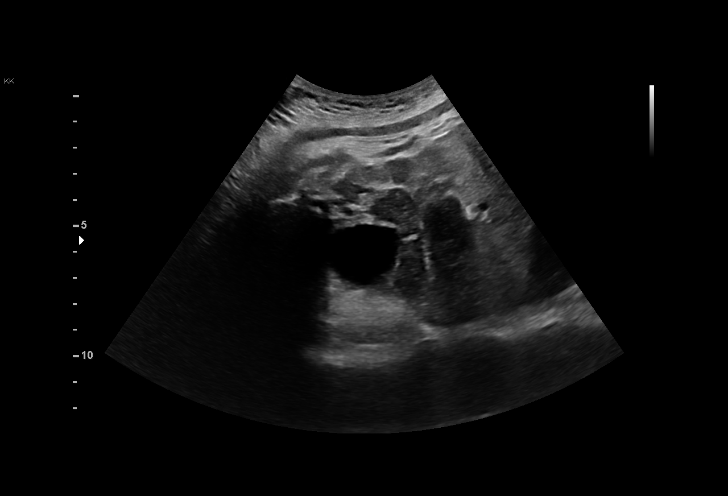
[im 91/175]
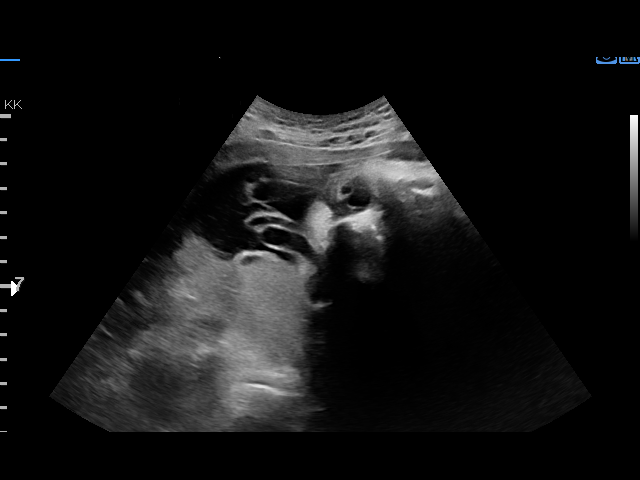
[im 104/175]
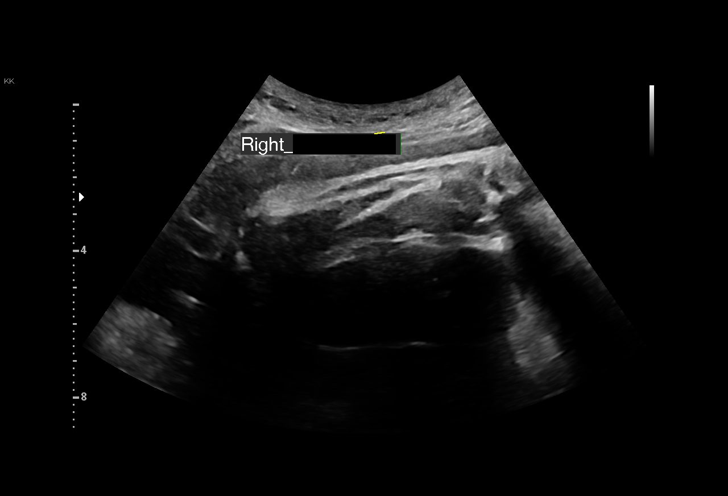
[im 117/175]
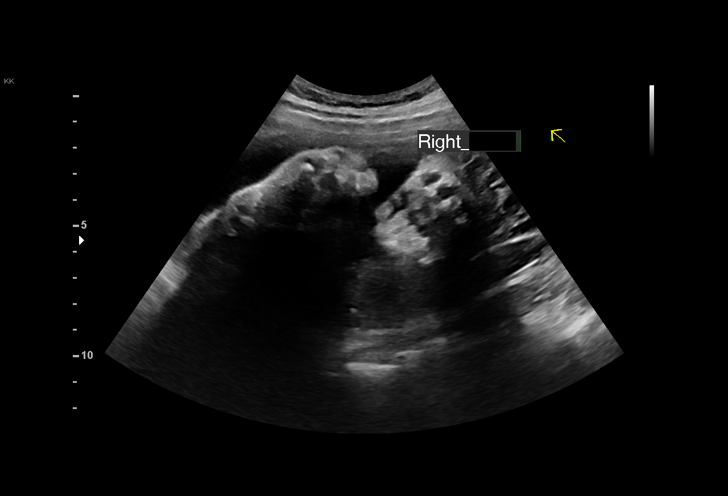
[im 129/175]
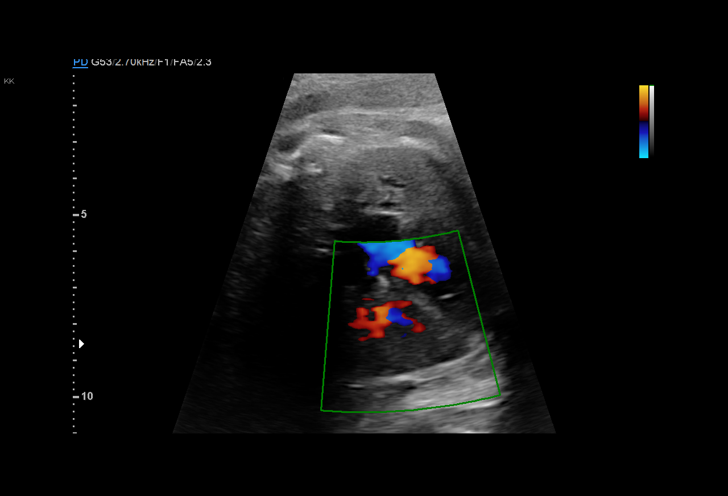
[im 142/175]
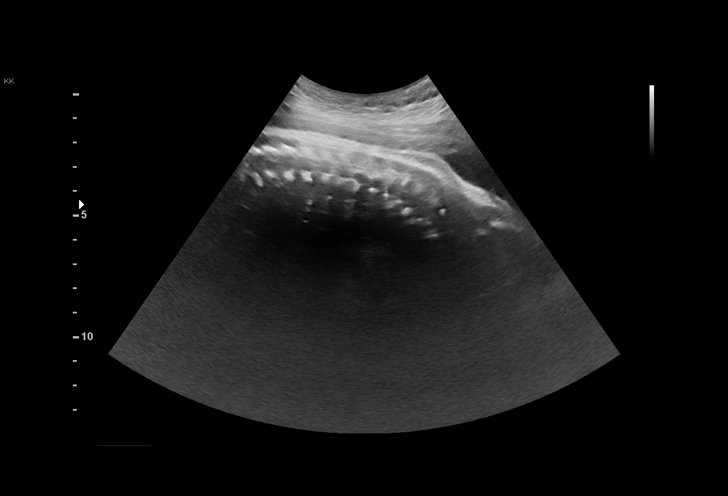
[im 155/175]
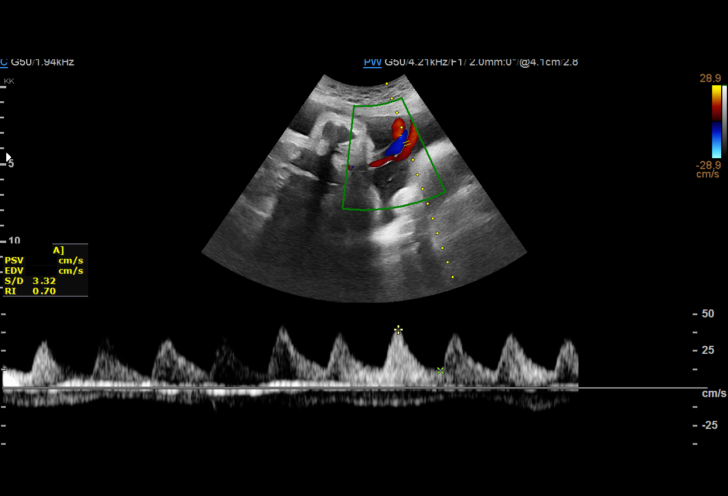
[im 168/175]
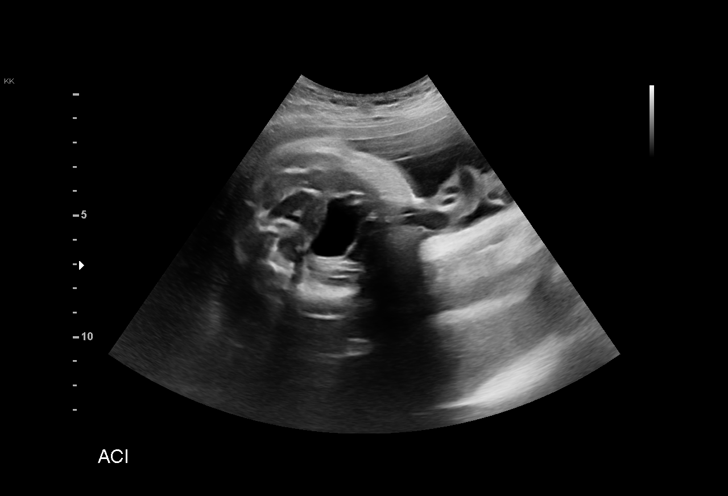

[13 of 28 positions shown; findings below may reference images not displayed]

FINDINGS: 1. Single intrauterine pregnancy with normal cardiac activity.
2. Estimated fetal weight is in the <10th%.
3. Posterior placenta without evidence of previa.
4. Normal amniotic fluid index.
5. The anatomy survey is limited as above by advanced
gestational age; no abnormalities seen on limited exam.
6. Biophysical profile is [DATE] (-2 for breathing).
7. Elevated systolic/diastolic ratio on umbilical artery Doppler
studies; there is persistent forward flow.
Recommendations

1. Fetal growth restriction:
- dating is based on sure LMP and a 96w6d femur length
- discussed given growth restriction and elevated Doppler
studies at term there is an increased risk of fetal demise and I
recommend induction today
2. Normal limited anatomy survey:
- discussed limitations of ultrasound in detecting fetal
anomalies
3. BPP [DATE]:
- had reactive NST
- overall BPP [DATE]

Discussed with Dr. Shi who is agreement with the
recommendations as is the patient. The patient was sent to
L&D for induction.

## 2019-02-10 NOTE — Progress Notes (Signed)
Patient sent to allergist for testing in anticipation of treatment.  Will follow up once complete.  JE, CNM

## 2019-02-17 ENCOUNTER — Ambulatory Visit (INDEPENDENT_AMBULATORY_CARE_PROVIDER_SITE_OTHER): Payer: Medicaid Other | Admitting: Allergy

## 2019-02-17 ENCOUNTER — Other Ambulatory Visit: Payer: Self-pay

## 2019-02-17 ENCOUNTER — Encounter: Payer: Self-pay | Admitting: Allergy

## 2019-02-17 VITALS — BP 100/48 | HR 90 | Temp 97.9°F | Resp 16 | Ht 64.5 in | Wt 135.8 lb

## 2019-02-17 DIAGNOSIS — T50905D Adverse effect of unspecified drugs, medicaments and biological substances, subsequent encounter: Secondary | ICD-10-CM

## 2019-02-17 NOTE — Patient Instructions (Addendum)
Penicillin allergy  -Given that she is currently pregnant and is positive for GBS bacteriuria that is resistant to clindamycin she qualifies for penicillin testing and challenge.  It is recommended that GBS in the urine greater than 10,000 CFU should be treated appropriately however due to her penicillin allergy and resistance to clindamycin she at this time is not able to have appropriate treatment.  -I discussed with the patient today regarding penicillin skin testing as well as challenge.  We did discuss the benefits and the risks of the skin testing as well as challenge given she is pregnant.  She verbally consented to both skin testing today and challenge.  Penicillin skin testing has been very well-tolerated in the pregnant population.  Her skin testing today was negative with a positive histamine control.  This makes her having a true IgE mediated drug allergy to penicillin at this time remarkably low.  Thus we will proceed with a oral challenge in the office to ensure that she is not allergic to penicillins.  She will return in the morning for the challenge.

## 2019-02-17 NOTE — Progress Notes (Signed)
New Patient Note  RE: Diane Ramirez MRN: [id number] DOB: 06-16-1990 Date of Office Visit: 02/17/2019  Referring provider: Gavin Pound, CNM Primary care provider: Luisa Dago, MD  Chief Complaint: pcn allergy  History of present illness: Diane Ramirez is a 28 y.o. female presenting today for consultation for PCN allergy with GBS+ bacteriuria currently 5 months pregnant.  Per her CNM her GBS is resistant to clindamycin which is an appropriate alternative for PCN allergy pts.  Due to this she was referred for testing and possible challenge.    She states her pregnancy has been unremarkable thus far except for the GBS+ bacteriuria which is currently not being treated since she is PCN allergic and clindamycin will not be effective.    She states her mother told her she would break out in hives/rash with PCN as a child.  She believes she may have been around the age or 60 or 8 at the time.   She has not had PCN based antibiotics sine this time.  She otherwise states she was never told of any other symptoms besides the rash.    She denies history of asthma, eczema, food allergy or seasonal/perennial allergic rhinitis or conjunctivitis.    Review of systems: Review of Systems  Constitutional: Negative for chills, fever and malaise/fatigue.  HENT: Negative for congestion, ear discharge, nosebleeds, sinus pain and sore throat.   Eyes: Negative for pain, discharge and redness.  Respiratory: Negative for cough, shortness of breath and wheezing.   Cardiovascular: Negative for chest pain.  Gastrointestinal: Negative for abdominal pain, constipation, diarrhea, heartburn, nausea and vomiting.  Musculoskeletal: Negative for joint pain.  Skin: Negative for itching and rash.  Neurological: Negative for headaches.    All other systems negative unless noted above in HPI  Past medical history: Past Medical History:  Diagnosis Date  . GBS bacteriuria     Past surgical history: Past  Surgical History:  Procedure Laterality Date  . NO PAST SURGERIES      Family history:  Family History  Problem Relation Age of Onset  . Cancer Maternal Grandmother   . Cancer Paternal Grandmother     Social history: Lives in a home without carpeting with electric heating and central cooling.  No pets in the home but there are cats outside the home.  She works as a Oncologist.  She denies a smoking history.  Medication List: Allergies as of 02/17/2019      Reactions   Penicillins Swelling   Has patient had a PCN reaction causing immediate rash, facial/tongue/throat swelling, SOB or lightheadedness with hypotension: yes Has patient had a PCN reaction causing severe rash involving mucus membranes or skin necrosis: no Has patient had a PCN reaction that required hospitalization:no Has patient had a PCN reaction occurring within the last 10 years: {no If all of the above answers are "NO", then may proceed with Cephalosporin use.      Medication List       Accurate as of February 17, 2019  4:21 PM. If you have any questions, ask your nurse or doctor.        STOP taking these medications   metroNIDAZOLE 500 MG tablet Commonly known as: Flagyl Stopped by: Shaylar Charmian Muff, MD     TAKE these medications   aspirin EC 81 MG tablet Take 1 tablet (81 mg total) by mouth daily.   ferrous sulfate 325 (65 FE) MG EC tablet Take 1 tablet (325 mg total) by mouth daily with  breakfast.   multivitamin-prenatal 27-0.8 MG Tabs tablet Take 1 tablet by mouth daily at 12 noon.       Known medication allergies: Allergies  Allergen Reactions  . Penicillins Swelling    Has patient had a PCN reaction causing immediate rash, facial/tongue/throat swelling, SOB or lightheadedness with hypotension: yes Has patient had a PCN reaction causing severe rash involving mucus membranes or skin necrosis: no Has patient had a PCN reaction that required hospitalization:no Has patient had a PCN  reaction occurring within the last 10 years: {no If all of the above answers are "NO", then may proceed with Cephalosporin use.      Physical examination: Blood pressure (!) 100/48, pulse 90, temperature 97.9 F (36.6 C), temperature source Temporal, resp. rate 16, height 5' 4.5" (1.638 m), weight 135 lb 12.8 oz (61.6 kg), last menstrual period 09/13/2018, SpO2 98 %, unknown if currently breastfeeding.  General: Alert, interactive, in no acute distress. HEENT: PERRLA, TMs pearly gray, turbinates non-edematous without discharge, post-pharynx non erythematous. Neck: Supple without lymphadenopathy. Lungs: Clear to auscultation without wheezing, rhonchi or rales. {no increased work of breathing. CV: Normal S1, S2 without murmurs. Abdomen: Nondistended, nontender. Skin: Warm and dry, without lesions or rashes. Extremities:  No clubbing, cyanosis or edema. Neuro:   Grossly intact.  Diagnositics/Labs: Benefits and risk discussed today with pt regarding penicillin skin testing today.  She was agreeable to proceed with the skin testing today.  Allergy testing:  PCN skin testing performed Prick testing -  Histamine: positive Saline control: Negative Pre-Pen: Negative Penicillin G 5000 U/mL: Negative  Intradermal- Saline control: Negative for any to any false that Pre-Pen: Negative Penicillin G 50 U/mL: Negative Penicillin G 500 U/mL: Negative Penicillin G 5000 U/mL: Negative  Allergy testing results were read and interpreted by provider, documented by clinical staff.  Assessment and plan: Patient Instructions  Penicillin allergy  -Given that she is currently pregnant and is positive for GBS bacteriuria that is resistant to clindamycin she qualifies for penicillin testing and challenge.  It is recommended that GBS in the urine greater than 10,000 CFU should be treated appropriately however due to her penicillin allergy and resistance to clindamycin she at this time is not able to have  appropriate treatment.  -I discussed with the patient today regarding penicillin skin testing as well as challenge.  We did discuss the benefits and the risks of the skin testing as well as challenge given she is pregnant.  She verbally consented to both skin testing today and challenge.  Penicillin skin testing has been very well-tolerated in the pregnant population.  Her skin testing today was negative with a positive histamine control.  This makes her having a true IgE mediated drug allergy to penicillin at this time remarkably low.  Thus we will proceed with a oral challenge in the office to ensure that she is not allergic to penicillins.  She will return in the morning for the challenge.  I appreciate the opportunity to take part in Diane Ramirez's care. Please do not hesitate to contact me with questions.  Sincerely,   Margo AyeShaylar Padgett, MD Allergy/Immunology [facility]

## 2019-02-18 ENCOUNTER — Ambulatory Visit (INDEPENDENT_AMBULATORY_CARE_PROVIDER_SITE_OTHER): Payer: Medicaid Other | Admitting: Allergy

## 2019-02-18 ENCOUNTER — Ambulatory Visit (HOSPITAL_COMMUNITY): Payer: Self-pay | Admitting: Genetic Counselor

## 2019-02-18 ENCOUNTER — Encounter: Payer: Self-pay | Admitting: Allergy

## 2019-02-18 ENCOUNTER — Ambulatory Visit (HOSPITAL_COMMUNITY): Payer: Medicaid Other

## 2019-02-18 ENCOUNTER — Ambulatory Visit (HOSPITAL_COMMUNITY): Payer: Medicaid Other | Attending: Obstetrics and Gynecology | Admitting: Genetic Counselor

## 2019-02-18 VITALS — BP 102/60 | HR 86 | Temp 97.6°F | Resp 16

## 2019-02-18 DIAGNOSIS — Z315 Encounter for genetic counseling: Secondary | ICD-10-CM | POA: Diagnosis not present

## 2019-02-18 DIAGNOSIS — T50905D Adverse effect of unspecified drugs, medicaments and biological substances, subsequent encounter: Secondary | ICD-10-CM

## 2019-02-18 DIAGNOSIS — Z148 Genetic carrier of other disease: Secondary | ICD-10-CM

## 2019-02-18 DIAGNOSIS — Z3A22 22 weeks gestation of pregnancy: Secondary | ICD-10-CM

## 2019-02-18 NOTE — Progress Notes (Signed)
02/18/2019  Quanesha Klimaszewski 01/16/1991 MRN: [id number] DOV: 02/18/2019  Ms. 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 Ms. Burrill came to her appointment alone due to COVID-19 visitor restrictions.   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 Keinath is a G2P1001, 28 y.o. female. Her current pregnancy has completed [redacted]w[redacted]d(Estimated Date of Delivery: 06/20/19).  Ms. CMcferrandenied exposure to environmental toxins or chemical agents. She denied the use of alcohol, tobacco or street drugs. She denied significant viral illnesses, fevers, and bleeding during the course of her pregnancy. Her medical and surgical histories were noncontributory.  Family History  A three generation pedigree was drafted and reviewed. The family history is remarkable for the following:  - Ms. Shambaugh's paternal grandmother and maternal grandmother both died of laryngeal cancer around 26years of age. They were reportedly both smokers. Ms. CCrumematernal aunt currently has stage IV cancer that was diagnosed at age 26 Ms. CTangowas not sure of the particular cancer type this individual has, as the family does not openly discuss health information. Though most cancers are thought to be sporadic or due to environmental factors, some families appear to have a strong predisposition to cancers. When considering a family history of cancer, we look for common types of cancer in multiple family members occurring at younger than typical ages. We discussed the option of meeting with a cancer genetic counselor to discuss any possible screening or testing options available. If Ms. CHojnackiis concerned about the family history of cancer and would like to learn more about the family's chance  for an inherited cancer syndrome, her doctor may refer her or her relatives to the [facility]([phone].   The remaining family histories were reviewed and found to be noncontributory for birth defects, intellectual disability, recurrent pregnancy loss, and known genetic conditions. Ms. CShannahanhad limited information about her partner's paternal family history; thus, risk assessment was limited.  The patient's ethnicity is [place] The father of the pregnancy's ethnicity is [place] and Native AOptometrist Ashkenazi Jewish ancestry and consanguinity were denied. Pedigree will be scanned under Media.  Discussion  Ms.Shimer had [facility]-14carrier screeningperformedthrough [facility]. The results of the screendetected one normal sized CGG repeat (20 repeats) and an intermediate sized CGG repeat (51 repeats) for fragile X syndrome.  Fragile X syndrome is the most common inherited cause of intellectual disability. Fragile X syndrome is caused by mutations in the FMR1 gene on the X chromosome. Nearly all cases of fragile X syndrome are caused by mutations in the CGG trinucleotide repeat region of the FMR1 gene. Typically, the FMR1 gene contains 6 to 44 CGG trinucleotide repeats. Individuals with this number of repeats are not affected by fragile X syndrome. Individuals with fragile X syndrome have over 200 CGG repeats. This abnormally expanded CGG trinucleotide repeat segment silences the FMR1 gene, which prevents the production of the FMRP protein. Loss or deficiency of this protein leads to the symptoms associated with fragile X syndrome, including mild to moderate intellectual disability, developmental delays, autism, behavioral abnormalities, and characteristic physical features. Fragile X syndrome usually affects males more severely than females, since males have one X chromosome and females have two.  Repeat sizes of 45-54 in the CGG  trinucleotide segment of the FMR1 gene are considered to be intermediate sized repeats. Repeats of this size are also commonly referred to as the "gray zone". Individuals with repeat alleles that fall in the intermediate range are not at risk of having a child affected by fragile X syndrome, as an intermediate allele is not at risk of expanding to a full mutation (>200 repeats) in one generation. Individuals who have an allele that falls in the intermediate range do not experience any symptoms themselves.  Individuals with 55-200 CGG trinucleotide repeats in the FMR1 gene are said to be premutation carriers for fragile X syndrome. Individuals with this number of repeats are at risk for their children to inherit an expanded allele (>200 repeats) and potentially be affected by fragile X syndrome. Premutation carriers also may experience symptoms of their own, such as symptoms associated with Fragile X-associated primary ovarian insufficiency (FXPOI) or fragile X-associated tremor/ataxia syndrome (FXTAS).  The largest known repeat size to expand into a full mutation is 56 repeats, which falls in the premutation range. Ms. Ruff largest repeat size of 51 falls in the intermediate range, indicating that she is not at increased risk to have a child affected by fragile X syndrome. However, it is possible that an intermediate allele may expand into a premutation in the next generation; thus, Ms. Colan's children may be at risk to have children of their own who are affected by fragile X syndrome.  Ms. Baize was also found to have 2 copies of the SMN1 gene on carrier screening; however, she also has the c.*3+80T>G polymorphism of SMN1 in intron 7 (also known as [id number]>G). This puts her at increased risk (1 in 58) to be a silent 2+0 carrier for spinal muscular atrophy (SMA). SMA is a condition caused by mutations in the SMN1 gene. SMA is characterized by progressive muscle weakness and atrophy due to degeneration  and loss of anterior horn cells (lower motor neurons) in the spinal cord and brain stem. We discussed the different types of SMA (0, I, II, and III), including differences in severity and age of onset. We also reviewed the autosomal recessive inheritance pattern of SMA.   We discussed that carrier testing by copy number analysis is recommended for the father of the pregnancy. Based on the carrier frequency for SMA in the Hispanic population, he currently has a 1 in 117 chance of being a carrier of SMA. If he is found to have 2 copies of SMN1, his risk of being a carrier is reduced but not eliminated. Without knowing her partner's carrier status, the risk for the couple to have an affected child is 1 in 8. If both parents are carriers of SMA, there is a 1 in 4 (25%) chance of having an affected fetus.  Ms. Truluck carrier screening was negative for the other 12 conditions screened. Thus, her risk to be a carrier for these additional conditions (listed separately in the laboratory report) has been reduced but not eliminated. Ms. Scogin indicated that she is interested in pursuing SMA carrier screening for the father of the pregnancy. However, she was not sure if her partner would be willing to undergo carrier screening. She was informed that babies born in [place] undergo newborn screening for several genetic conditions immediately after birth, but that SMA currently is not included on that list of conditions. We discussed the Early Check research study to add SMA to her baby's newborn screening panel. Ms. Peery indicated that she was interested in pursuing  this, so she was given information on how to enroll in the Early Check study.   We also reviewed that Ms. Coward had Panorama NIPS through [place] that was low-risk for fetal aneuploidies. We reviewed that these results showed a less than 1 in 10,000 risk for trisomies 21, 18 and 13, and monosomy X (Turner syndrome).  In addition, the risk for  triploidy and sex chromosome trisomies (47,XXX and 47,XXY) was also low. Ms. Rosetti elected to have cffDNA analysis for 22q11 deletion syndrome, which was also low risk (1 in 9000). We reviewed that while this testing identifies > 99% of pregnancies with trisomy 46, trisomy 22, sex chromosome trisomies (47,XXX and 47,XXY), and triploidy, it is NOT diagnostic. A positive test result requires confirmation by CVS or amniocentesis, and a negative test result does not rule out a fetal chromosome abnormality. She also understands that this testing does not identify all genetic conditions.   Ms. Armentrout was also counseled regarding diagnostic testing via amniocentesis. We discussed the technical aspects of the procedure and quoted up to a 1 in 500 (0.2%) risk for spontaneous pregnancy loss or other adverse pregnancy outcomes as a result of amniocentesis. Cultured cells from an amniocentesis sample allow for the visualization of a fetal karyotype, which can detect >99% of chromosomal aberrations. Chromosomal microarray can also be performed to identify smaller deletions or duplications of fetal chromosomal material. Amniocentesis could also be performed to assess whether the baby is affected by SMA. After careful consideration, Ms. Smyth declined amniocentesis at this time, although she would consider prenatal diagnosis if her partner is found to be a carrier for SMA.  Ms. Jagoda desires carrier screening for SMA for her partner, Christy Sartorius however, she wanted to talk to him first to determine whether he would prefer getting his blood drawn or having a saliva kit mailed to his house. Ms. Kwasny was provided with my business card and instructed to call me when her and her partner had made a decision about testing. From there, I will coordinate sample collection and facilitate the testing process.   I counseled Ms. Sooy regarding the above risks and available options. The approximate face-to-face time with the genetic counselor  was 30 minutes.  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 Willcall me to discuss whether her partner prefers to undergo a blood draw or have a saliva kit mailed to his home  Reviewed low-risk NIPS results  Reduction in risk for Down syndrome,trisomy 18,trisomy 60, sex chromosome aneuploidies, and 22q11 deletion syndrome  Offered additional testing and screening  Declined amniocentesis at this time  Reviewed family history concerns   Buelah Manis, MS Genetic Counselor

## 2019-02-18 NOTE — Patient Instructions (Addendum)
Penicillin allergy  - PCN skin testing performed on 02/17/2019 was negative with adequate controls  - oral PCN challenge with use of PCN VK with total dose of 500mg  was performed today (02/18/2019) and was successfully passed without any adverse symptoms  - you are NOT allergy to penicillin based antibiotics and can use for treatment if needed in the future.

## 2019-02-18 NOTE — Progress Notes (Signed)
Follow-up Note  RE: Diane Ramirez MRN: [id number] DOB: 02-27-1991 Date of Office Visit: 02/18/2019   History of present illness: Diane Pippinsiajah Addis is a 28 y.o. female presenting today for oral challenge today to PCN.  She presented for initial evaluation for PCN allergy.  She is GBS+ bacteriuria currently 5 months pregnant.  Per her CNM her GBS is resistant to clindamycin which is an appropriate alternative for PCN allergy pts.  PCN skin testing performed at initial visit on 02/17/2019 which was negative with adequate controls.    She has remained off antihistamines for oral challenge today.    She is doing well today with no complaints.    Review of systems: Review of Systems  Constitutional: Negative for chills, fever and malaise/fatigue.  HENT: Negative for congestion, ear discharge, nosebleeds and sore throat.   Eyes: Negative for pain, discharge and redness.  Respiratory: Negative for cough, shortness of breath and wheezing.   Cardiovascular: Negative for chest pain.  Gastrointestinal: Negative for abdominal pain, constipation, diarrhea, heartburn, nausea and vomiting.  Musculoskeletal: Negative for joint pain.  Skin: Negative for itching and rash.  Neurological: Negative for headaches.    All other systems negative unless noted above in HPI  Past medical/social/surgical/family history have been reviewed and are unchanged unless specifically indicated below.  No changes  Medication List: Allergies as of 02/18/2019   No Active Allergies     Medication List       Accurate as of February 18, 2019 12:29 PM. If you have any questions, ask your nurse or doctor.        aspirin EC 81 MG tablet Take 1 tablet (81 mg total) by mouth daily.   ferrous sulfate 325 (65 FE) MG EC tablet Take 1 tablet (325 mg total) by mouth daily with breakfast.   multivitamin-prenatal 27-0.8 MG Tabs tablet Take 1 tablet by mouth daily at 12 noon.       Known medication allergies: No  Active Allergies   Physical examination: Blood pressure 102/60, pulse 86, temperature 97.6 F (36.4 C), temperature source Temporal, resp. rate 16, last menstrual period 09/13/2018, SpO2 99 %,   General: Alert, interactive, in no acute distress. HEENT: PERRLA, TMs pearly gray, turbinates non-edematous without discharge, post-pharynx non erythematous. Neck: Supple without lymphadenopathy. Lungs: Clear to auscultation without wheezing, rhonchi or rales. {no increased work of breathing. CV: Normal S1, S2 without murmurs. Abdomen: Nondistended, nontender. Skin: Warm and dry, without lesions or rashes. Extremities:  No clubbing, cyanosis or edema. Neuro:   Grossly intact.  Diagnositics/Labs: Drug challenge to PCN with use of PCN VK total dose 500mg . Benefits and risks of challenge discussed and verbal consent from pt obtained.  She was provided with 3 doses of PCN VK (1%, 10%, 90% doses) every 20 minutes and consumed total of 500mg .  She was observed for additional hour after completion of ingestion challenge.  She had no signs/symptoms of allergic reaction.  Vitals were obtained prior to discharge and remained stable.     Assessment and plan:   Penicillin allergy -Given that she is currently pregnant and is positive for GBS bacteriuria that is resistant to clindamycin she qualifies for penicillin testing and challenge.  It is recommended that GBS in the urine greater than 10,000 CFU should be treated appropriately however due to her penicillin allergy and resistance to clindamycin she at this time is not able to have appropriate treatment.   - PCN skin testing performed on 02/17/2019 was negative with adequate controls  -  oral PCN challenge with use of PCN VK with total dose of 500mg  was performed today (02/18/2019) and was successfully passed without any adverse symptoms  - you are NOT allergic to penicillin based antibiotics and can use for treatment if needed in the future.    - PCN has  been removed from her allergy list  - it has been discussed that a rash can develop with amoxicillin and other PCNs that is not IgE mediated and typically can treat the itch/rash to continue on the antibiotic.     Return if symptoms worsen or fail to improve.    I appreciate the opportunity to take part in Yannis's care. Please do not hesitate to contact me with questions.  Sincerely,   Prudy Feeler, MD Allergy/Immunology [facility] [place]

## 2019-02-19 ENCOUNTER — Telehealth: Payer: Medicaid Other

## 2019-02-19 ENCOUNTER — Telehealth: Payer: Self-pay | Admitting: General Practice

## 2019-02-19 NOTE — Telephone Encounter (Signed)
Called patient to check in for 9:10am Mychart appointment.  Left message for pt to give our office a call back.

## 2019-02-22 ENCOUNTER — Other Ambulatory Visit (HOSPITAL_COMMUNITY): Payer: Self-pay

## 2019-02-22 DIAGNOSIS — R8271 Bacteriuria: Secondary | ICD-10-CM

## 2019-02-22 MED ORDER — CEPHALEXIN 500 MG PO CAPS: 500.0000 mg | ORAL_CAPSULE | Freq: Four times a day (QID) | ORAL | 0 refills | Status: DC

## 2019-02-22 NOTE — Progress Notes (Signed)
Allergy test negative.  Rx for Keflex 500mg  QID x 7 days.

## 2019-03-01 ENCOUNTER — Encounter: Payer: Self-pay | Admitting: Family Medicine

## 2019-03-01 ENCOUNTER — Ambulatory Visit: Payer: Medicaid Other | Admitting: Family Medicine

## 2019-03-02 ENCOUNTER — Telehealth: Payer: Self-pay | Admitting: *Deleted

## 2019-03-02 NOTE — Telephone Encounter (Signed)
Left voice message to check in regards to a blood pressure monitor. Will send a Mychart message.  Derl Barrow, RN

## 2019-03-05 ENCOUNTER — Telehealth (HOSPITAL_COMMUNITY): Payer: Self-pay | Admitting: Genetic Counselor

## 2019-03-05 NOTE — Telephone Encounter (Signed)
LVM for Diane Ramirez to check in on partner carrier screening for spinal muscular atrophy (SMA). Diane Ramirez indicated that she would like her partner to undergo carrier screening for SMA during our genetic counseling session on 9/10. At that time, Diane Ramirez indicated that she would let me know if her partner preferred getting his blood drawn or having a saliva kit mailed to his house; however, I have not heard back from her about her partner's preference. I requested that she call me back and let me know if she is still interested in pursuing partner carrier screening and provided the phone number for my direct line.  Buelah Manis, MS Genetic Counselor

## 2019-03-16 ENCOUNTER — Telehealth: Payer: Self-pay | Admitting: General Practice

## 2019-03-16 NOTE — Telephone Encounter (Signed)
Left message on VM in regards to follow up [facility] 2hr gtts appt scheduled for 04/01/2019 at 0810.  Asked patient to contact our office or send [facility] message with any questions or concerns.

## 2019-03-17 NOTE — Telephone Encounter (Signed)
Left voice message for patient to call the office regarding blood pressure monitor.  Derl Barrow, RN

## 2019-03-17 NOTE — Telephone Encounter (Signed)
Patient called back. Pt stated she does not have a cuff. She did sign up for babyscripts. Will send in Rx to pharmacy.  Derl Barrow, RN

## 2019-03-18 ENCOUNTER — Encounter: Payer: Self-pay | Admitting: General Practice

## 2019-03-22 ENCOUNTER — Encounter: Payer: Self-pay | Admitting: Obstetrics and Gynecology

## 2019-03-22 ENCOUNTER — Ambulatory Visit: Payer: Medicaid Other | Admitting: Obstetrics and Gynecology

## 2019-03-22 DIAGNOSIS — Z148 Genetic carrier of other disease: Secondary | ICD-10-CM | POA: Insufficient documentation

## 2019-03-25 ENCOUNTER — Encounter (HOSPITAL_COMMUNITY): Payer: Self-pay

## 2019-03-25 ENCOUNTER — Ambulatory Visit (HOSPITAL_COMMUNITY): Payer: Medicaid Other

## 2019-03-25 ENCOUNTER — Ambulatory Visit (HOSPITAL_COMMUNITY)
Admission: RE | Admit: 2019-03-25 | Discharge: 2019-03-25 | Disposition: A | Discharge status: Home / Self Care | Payer: Medicaid Other | Source: Ambulatory Visit | Attending: Obstetrics and Gynecology | Admitting: Obstetrics and Gynecology

## 2019-03-26 NOTE — Progress Notes (Signed)
Patient did not keep her colposcopy appointment for 03/22/2019.  Durene Romans MD Attending Center for [employer] [facility] [employer])

## 2019-04-01 ENCOUNTER — Encounter: Payer: Self-pay | Admitting: General Practice

## 2019-04-01 ENCOUNTER — Telehealth: Payer: Self-pay | Admitting: General Practice

## 2019-04-01 ENCOUNTER — Encounter: Payer: Medicaid Other | Admitting: Obstetrics and Gynecology

## 2019-04-01 NOTE — Telephone Encounter (Signed)
Patient missed her f/u OB appt this morning.  Called patient to rescheduled appt, but no answer.  Left message for patient to give our office a call back to reschedule.  Will send a Mychart message and letter will be mailed.

## 2019-05-26 ENCOUNTER — Other Ambulatory Visit (HOSPITAL_COMMUNITY)
Admission: RE | Admit: 2019-05-26 | Discharge: 2019-05-26 | Disposition: A | Discharge status: Home / Self Care | Payer: Medicaid Other | Source: Ambulatory Visit | Attending: Advanced Practice Midwife | Admitting: Advanced Practice Midwife

## 2019-05-26 ENCOUNTER — Inpatient Hospital Stay (HOSPITAL_COMMUNITY)
Admission: AD | Admit: 2019-05-26 | Discharge: 2019-05-26 | Disposition: A | Discharge status: Home / Self Care | Payer: Medicaid Other | Attending: Obstetrics & Gynecology | Admitting: Obstetrics & Gynecology

## 2019-05-26 ENCOUNTER — Telehealth: Payer: Self-pay | Admitting: General Practice

## 2019-05-26 ENCOUNTER — Other Ambulatory Visit: Payer: Self-pay

## 2019-05-26 ENCOUNTER — Encounter: Payer: Self-pay | Admitting: General Practice

## 2019-05-26 ENCOUNTER — Encounter (HOSPITAL_COMMUNITY): Payer: Self-pay | Admitting: Obstetrics & Gynecology

## 2019-05-26 ENCOUNTER — Ambulatory Visit (INDEPENDENT_AMBULATORY_CARE_PROVIDER_SITE_OTHER): Payer: Medicaid Other | Admitting: Advanced Practice Midwife

## 2019-05-26 ENCOUNTER — Encounter: Payer: Self-pay | Admitting: Advanced Practice Midwife

## 2019-05-26 VITALS — BP 135/81 | HR 72 | Temp 97.5°F | Wt 156.0 lb

## 2019-05-26 DIAGNOSIS — Z3A36 36 weeks gestation of pregnancy: Secondary | ICD-10-CM | POA: Insufficient documentation

## 2019-05-26 DIAGNOSIS — O10013 Pre-existing essential hypertension complicating pregnancy, third trimester: Secondary | ICD-10-CM | POA: Insufficient documentation

## 2019-05-26 DIAGNOSIS — Z8759 Personal history of other complications of pregnancy, childbirth and the puerperium: Secondary | ICD-10-CM | POA: Insufficient documentation

## 2019-05-26 DIAGNOSIS — Z3480 Encounter for supervision of other normal pregnancy, unspecified trimester: Secondary | ICD-10-CM

## 2019-05-26 DIAGNOSIS — Z3689 Encounter for other specified antenatal screening: Secondary | ICD-10-CM | POA: Diagnosis not present

## 2019-05-26 DIAGNOSIS — Z7982 Long term (current) use of aspirin: Secondary | ICD-10-CM | POA: Diagnosis not present

## 2019-05-26 DIAGNOSIS — Z23 Encounter for immunization: Secondary | ICD-10-CM | POA: Diagnosis not present

## 2019-05-26 DIAGNOSIS — Z87891 Personal history of nicotine dependence: Secondary | ICD-10-CM | POA: Diagnosis not present

## 2019-05-26 DIAGNOSIS — R030 Elevated blood-pressure reading, without diagnosis of hypertension: Secondary | ICD-10-CM

## 2019-05-26 HISTORY — DX: Gestational (pregnancy-induced) hypertension without significant proteinuria, unspecified trimester: O13.9

## 2019-05-26 HISTORY — DX: Essential (primary) hypertension: I10

## 2019-05-26 LAB — URINALYSIS, MICROSCOPIC (REFLEX)

## 2019-05-26 LAB — COMPREHENSIVE METABOLIC PANEL
ALT: 10 U/L (ref 0–44)
AST: 16 U/L (ref 15–41)
Albumin: 2.8 g/dL — ABNORMAL LOW (ref 3.5–5.0)
Alkaline Phosphatase: 383 U/L — ABNORMAL HIGH (ref 38–126)
Anion gap: 11 (ref 5–15)
BUN: 11 mg/dL (ref 6–20)
CO2: 19 mmol/L — ABNORMAL LOW (ref 22–32)
Calcium: 8.6 mg/dL — ABNORMAL LOW (ref 8.9–10.3)
Chloride: 107 mmol/L (ref 98–111)
Creatinine, Ser: 0.66 mg/dL (ref 0.44–1.00)
GFR calc Af Amer: >60 mL/min (ref >60)
GFR calc non Af Amer: >60 mL/min (ref >60)
Glucose, Bld: 121 mg/dL — ABNORMAL HIGH (ref 70–99)
Potassium: 3.7 mmol/L (ref 3.5–5.1)
Sodium: 137 mmol/L (ref 135–145)
Total Bilirubin: 0.2 mg/dL — ABNORMAL LOW (ref 0.3–1.2)
Total Protein: 6.3 g/dL — ABNORMAL LOW (ref 6.5–8.1)

## 2019-05-26 LAB — CBC WITH DIFFERENTIAL/PLATELET
Abs Immature Granulocytes: 0.02 10*3/uL (ref 0.00–0.07)
Basophils Absolute: 0 10*3/uL (ref 0.0–0.1)
Basophils Relative: 0 %
Eosinophils Absolute: 0.2 10*3/uL (ref 0.0–0.5)
Eosinophils Relative: 2 %
HCT: 35.7 % — ABNORMAL LOW (ref 36.0–46.0)
Hemoglobin: 12 g/dL (ref 12.0–15.0)
Immature Granulocytes: 0 %
Lymphocytes Relative: 37 %
Lymphs Abs: 2.9 10*3/uL (ref 0.7–4.0)
MCH: 28.9 pg (ref 26.0–34.0)
MCHC: 33.6 g/dL (ref 30.0–36.0)
MCV: 86 fL (ref 80.0–100.0)
Monocytes Absolute: 0.5 10*3/uL (ref 0.1–1.0)
Monocytes Relative: 7 %
Neutro Abs: 4.2 10*3/uL (ref 1.7–7.7)
Neutrophils Relative %: 54 %
Platelets: 185 10*3/uL (ref 150–400)
RBC: 4.15 MIL/uL (ref 3.87–5.11)
RDW: 13.3 % (ref 11.5–15.5)
WBC: 7.9 10*3/uL (ref 4.0–10.5)
nRBC: 0 % (ref 0.0–0.2)

## 2019-05-26 LAB — URINALYSIS, ROUTINE W REFLEX MICROSCOPIC
Bilirubin Urine: NEGATIVE
Glucose, UA: NEGATIVE mg/dL
Ketones, ur: NEGATIVE mg/dL
Leukocytes,Ua: NEGATIVE
Nitrite: NEGATIVE
Protein, ur: NEGATIVE mg/dL
Specific Gravity, Urine: 1.02 (ref 1.005–1.030)
pH: 7 (ref 5.0–8.0)

## 2019-05-26 LAB — PROTEIN / CREATININE RATIO, URINE
Creatinine, Urine: 115.3 mg/dL
Protein Creatinine Ratio: 0.29 mg/mg{Cre} — ABNORMAL HIGH (ref 0.00–0.15)
Total Protein, Urine: 33 mg/dL

## 2019-05-26 NOTE — MAU Provider Note (Signed)
History     CSN: [id number]  Arrival date and time: 05/26/19 4098   First Provider Initiated Contact with Patient 05/26/19 1008      Chief Complaint  Patient presents with  . BP Evaluation   Ms. Diane Ramirez is a 28 y.o. G2P1001 at [redacted]w[redacted]d who presents to MAU for preeclampsia evaluation after she was seen in the office today and found to have a blood pressure of 141/91, with repeat 130s/80s. Pt has hx of preeclampsia with first pregnancy.  Pt denies HA, blurry vision/seeing spots, N/V, epigastric pain, swelling in face and hands, sudden weight gain. Pt denies chest pain and SOB.  Pt denies constipation, diarrhea, or urinary problems. Pt denies fever, chills, fatigue, sweating or changes in appetite. Pt denies dizziness, light-headedness, weakness.  Pt denies VB, ctx, LOF and reports good FM.  Current pregnancy problems? Hx of preeclampsia, low lying placenta, GBS POSITIVE in urine Blood Type? O Positive Allergies? NKDA Current medications? PNVs, baby ASA, irone Current [facility] & next appt? [facility], no next appt scheduled   OB History    Gravida  2   Para  1   Term  1   Preterm  0   AB  0   Living  1     SAB  0   TAB  0   Ectopic  0   Multiple  0   Live Births  1           Past Medical History:  Diagnosis Date  . GBS bacteriuria   . Hypertension   . Pregnancy induced hypertension     Past Surgical History:  Procedure Laterality Date  . NO PAST SURGERIES      Family History  Problem Relation Age of Onset  . Cancer Maternal Grandmother   . Cancer Paternal Grandmother     Social History   Tobacco Use  . Smoking status: Former Smoker    Packs/day: 0.25    Years: 1.00    Pack years: 0.25    Types: Cigars    Quit date: 08/30/2017    Years since quitting: 1.7  . Smokeless tobacco: Never Used  Substance Use Topics  . Alcohol use: Not Currently  . Drug use: Not Currently    Types: Marijuana    Comment: last smoked November     Allergies: No Active Allergies  Medications Prior to Admission  Medication Sig Dispense Refill Last Dose  . aspirin EC 81 MG tablet Take 1 tablet (81 mg total) by mouth daily. 30 tablet 4 Past Month at Unknown time  . ferrous sulfate 325 (65 FE) MG EC tablet Take 1 tablet (325 mg total) by mouth daily with breakfast. 60 tablet 3 Past Month at Unknown time  . Prenatal Vit-Fe Fumarate-FA (MULTIVITAMIN-PRENATAL) 27-0.8 MG TABS tablet Take 1 tablet by mouth daily at 12 noon.   05/25/2019 at 1700    Review of Systems  Constitutional: Negative for chills, diaphoresis, fatigue and fever.  Eyes: Negative for visual disturbance.  Respiratory: Negative for shortness of breath.   Cardiovascular: Negative for chest pain.  Gastrointestinal: Negative for abdominal pain, constipation, diarrhea, nausea and vomiting.  Genitourinary: Negative for dysuria, flank pain, frequency, pelvic pain, urgency, vaginal bleeding and vaginal discharge.  Neurological: Negative for dizziness, weakness, light-headedness and headaches.   Physical Exam   Blood pressure 112/65, pulse 69, temperature 98 F (36.7 C), temperature source Oral, resp. rate 18, height  (1.651 m), weight 71.4 kg, last menstrual period 09/13/2018, SpO2 100 %,  unknown if currently breastfeeding.  Patient Vitals for the past 24 hrs:  BP Temp Temp src Pulse Resp SpO2 Height Weight  05/26/19 1145 112/65 - - 69 - - - -  05/26/19 1130 (!) 102/53 - - 79 - - - -  05/26/19 1100 117/70 - - 80 - - - -  05/26/19 1045 111/66 - - 85 - - - -  05/26/19 1030 110/66 - - 87 - - - -  05/26/19 1015 119/73 - - 85 - - - -  05/26/19 1000 117/74 - - 88 - - - -  05/26/19 0956 119/78 98 F (36.7 C) Oral 83 18 100 % - -  05/26/19 0944 - - - - - - 5\' 5"  (1.651 m) 71.4 kg   Physical Exam  Constitutional: She is oriented to person, place, and time. She appears well-developed and well-nourished. No distress.  HENT:  Head: Normocephalic and atraumatic.   Respiratory: Effort normal.  GI: Soft. She exhibits no distension and no mass. There is no abdominal tenderness. There is no rebound and no guarding.  Neurological: She is alert and oriented to person, place, and time.  Skin: Skin is warm and dry. She is not diaphoretic.  Psychiatric: She has a normal mood and affect. Her behavior is normal. Judgment and thought content normal.   Results for orders placed or performed during the hospital encounter of 05/26/19 (from the past 24 hour(s))  Comprehensive metabolic panel     Status: Abnormal   Collection Time: 05/26/19  9:54 AM  Result Value Ref Range   Sodium 137 135 - 145 mmol/L   Potassium 3.7 3.5 - 5.1 mmol/L   Chloride 107 98 - 111 mmol/L   CO2 19 (L) 22 - 32 mmol/L   Glucose, Bld 121 (H) 70 - 99 mg/dL   BUN 11 6 - 20 mg/dL   Creatinine, Ser 0.66 0.44 - 1.00 mg/dL   Calcium 8.6 (L) 8.9 - 10.3 mg/dL   Total Protein 6.3 (L) 6.5 - 8.1 g/dL   Albumin 2.8 (L) 3.5 - 5.0 g/dL   AST 16 15 - 41 U/L   ALT 10 0 - 44 U/L   Alkaline Phosphatase 383 (H) 38 - 126 U/L   Total Bilirubin 0.2 (L) 0.3 - 1.2 mg/dL   GFR calc non Af Amer >60 >60 mL/min   GFR calc Af Amer >60 >60 mL/min   Anion gap 11 5 - 15  CBC with Differential/Platelet     Status: Abnormal   Collection Time: 05/26/19  9:54 AM  Result Value Ref Range   WBC 7.9 4.0 - 10.5 K/uL   RBC 4.15 3.87 - 5.11 MIL/uL   Hemoglobin 12.0 12.0 - 15.0 g/dL   HCT 35.7 (L) 36.0 - 46.0 %   MCV 86.0 80.0 - 100.0 fL   MCH 28.9 26.0 - 34.0 pg   MCHC 33.6 30.0 - 36.0 g/dL   RDW 13.3 11.5 - 15.5 %   Platelets 185 150 - 400 K/uL   nRBC 0.0 0.0 - 0.2 %   Neutrophils Relative % 54 %   Neutro Abs 4.2 1.7 - 7.7 K/uL   Lymphocytes Relative 37 %   Lymphs Abs 2.9 0.7 - 4.0 K/uL   Monocytes Relative 7 %   Monocytes Absolute 0.5 0.1 - 1.0 K/uL   Eosinophils Relative 2 %   Eosinophils Absolute 0.2 0.0 - 0.5 K/uL   Basophils Relative 0 %   Basophils Absolute 0.0 0.0 - 0.1 K/uL  Immature Granulocytes 0 %    Abs Immature Granulocytes 0.02 0.00 - 0.07 K/uL  Urinalysis, Routine w reflex microscopic     Status: Abnormal   Collection Time: 05/26/19 10:04 AM  Result Value Ref Range   Color, Urine YELLOW YELLOW   APPearance HAZY (A) CLEAR   Specific Gravity, Urine 1.020 1.005 - 1.030   pH 7.0 5.0 - 8.0   Glucose, UA NEGATIVE NEGATIVE mg/dL   Hgb urine dipstick TRACE (A) NEGATIVE   Bilirubin Urine NEGATIVE NEGATIVE   Ketones, ur NEGATIVE NEGATIVE mg/dL   Protein, ur NEGATIVE NEGATIVE mg/dL   Nitrite NEGATIVE NEGATIVE   Leukocytes,Ua NEGATIVE NEGATIVE  Urinalysis, Microscopic (reflex)     Status: Abnormal   Collection Time: 05/26/19 10:04 AM  Result Value Ref Range   RBC / HPF 0-5 0 - 5 RBC/hpf   WBC, UA 0-5 0 - 5 WBC/hpf   Bacteria, UA FEW (A) NONE SEEN   Squamous Epithelial / LPF 6-10 0 - 5  Protein / creatinine ratio, urine     Status: Abnormal   Collection Time: 05/26/19 10:05 AM  Result Value Ref Range   Creatinine, Urine 115.30 mg/dL   Total Protein, Urine 33 mg/dL   Protein Creatinine Ratio 0.29 (H) 0.00 - 0.15 mg/mg[Cre]   MAU Course  Procedures  MDM -preeclampsia evaluation after patient had elevated pressure in the office of 141/91 -normal pressures in MAU, asymptomatic, normal exam -UA: hazy/trace hgb/few bacteria, sending urine for culture -CBC: WNL (platelets 185, were 207 36mo ago) -CMP: no abnormalities requiring treatment, AST/ALT 16/10 -PCr: 0.29 (trace hgb in UA) -EFM: reactive       -baseline: 130       -variability: moderate       -accels: present, 15x15       -decels: absent       -TOCO: irregular, few ctx -pt discharged to home in stable condition  Orders Placed This Encounter  Procedures  . Culture, OB Urine    Standing Status:   Standing    Number of Occurrences:   1  . Comprehensive metabolic panel    Standing Status:   Standing    Number of Occurrences:   1  . Protein / creatinine ratio, urine    Standing Status:   Standing    Number of  Occurrences:   1  . CBC with Differential/Platelet    Standing Status:   Standing    Number of Occurrences:   1  . Urinalysis, Routine w reflex microscopic    Standing Status:   Standing    Number of Occurrences:   1  . Urinalysis, Microscopic (reflex)    Standing Status:   Standing    Number of Occurrences:   1  . Discharge patient    Order Specific Question:   Discharge disposition    Answer:   01-Home or Self Care [1]    Order Specific Question:   Discharge patient date    Answer:   05/26/2019   No orders of the defined types were placed in this encounter.  Assessment and Plan   1. Transient elevated blood pressure   2. Supervision of other normal pregnancy, antepartum   3. [redacted] weeks gestation of pregnancy   4. NST (non-stress test) reactive    Allergies as of 05/26/2019   No Active Allergies     Medication List    TAKE these medications   aspirin EC 81 MG tablet Take 1 tablet (81 mg total) by  mouth daily.   ferrous sulfate 325 (65 FE) MG EC tablet Take 1 tablet (325 mg total) by mouth daily with breakfast.   multivitamin-prenatal 27-0.8 MG Tabs tablet Take 1 tablet by mouth daily at 12 noon.      -message sent to [facility] to schedule BP check on Friday and repeat PCr -strict preeclampsia/return MAU precautions given -pt discharged to home in stable condition  Joni Reining E Dailey Alberson 05/26/2019, 12:09 PM

## 2019-05-26 NOTE — MAU Note (Signed)
Pt sent from office for BP evaluation.  Denies H/A, visual disturbances, or epigastric pain.  Reports +FM.  Denies LOF or VB.  Reports hx of ^BP with previous pregnancy.

## 2019-05-26 NOTE — Telephone Encounter (Signed)
Left message on VM in regards to RN visit on Friday, 05/28/2019 at 10:00 for BP check and blood work.  Asked patient to give our office a call with any questions or concerns.

## 2019-05-26 NOTE — Progress Notes (Signed)
   PRENATAL VISIT NOTE  Subjective:  Diane Ramirez is a 28 y.o. G2P1001 at [redacted]w[redacted]d being seen today for ongoing prenatal care.  She is currently monitored for the following issues for this high-risk pregnancy and has Supervision of other normal pregnancy, antepartum; Anemia affecting pregnancy, antepartum; Hx of preeclampsia, prior pregnancy, currently pregnant, second trimester; History of penicillin allergy; GBS bacteriuria; Low-lying placenta in second trimester; Cervical dysplasia; Late prenatal care in second trimester; and Abnormal [employer] Screening on their problem list.  Patient reports no complaints.  Contractions: Not present. Vag. Bleeding: None.  Movement: Present. Denies leaking of fluid.   The following portions of the patient's history were reviewed and updated as appropriate: allergies, current medications, past family history, past medical history, past social history, past surgical history and problem list.   Objective:   Vitals:   05/26/19 0808 05/26/19 0826  BP: (!) 141/91 135/81  Pulse: 98 72  Temp: (!) 97.5 F (36.4 C)   Weight: 156 lb (70.8 kg)     Fetal Status: Fetal Heart Rate (bpm): 131   Movement: Present     General:  Alert, oriented and cooperative. Patient is in no acute distress.  Skin: Skin is warm and dry. No rash noted.   Cardiovascular: Normal heart rate noted  Respiratory: Normal respiratory effort, no problems with respiration noted  Abdomen: Soft, gravid, appropriate for gestational age.  Pain/Pressure: Absent     Pelvic: Cervical exam performed        Extremities: Normal range of motion.  Edema: None  Mental Status: Normal mood and affect. Normal behavior. Normal judgment and thought content.   Assessment and Plan:  Pregnancy: G2P1001 at [redacted]w[redacted]d 1. Supervision of other normal pregnancy, antepartum - Glucose Tolerance, 2 Hours w/1 Hour - HIV antibody (with reflex) - RPR - CBC - Tdap vaccine greater than or equal to 7yo IM - Culture,  beta strep (group b only) - Cervicovaginal ancillary only( [place])  2. Need for tetanus, diphtheria, and acellular pertussis (Tdap) vaccine in patient of adolescent age or older - tdap today - declined flu   3. Elevated blood pressure today - Hx of pre-eclampsia - Denies any HA, visual disturbance or RUQ pain - Due to hx and limited care during this pregnancy will send patient to MAU for monitoring and labs. Discussed with MAU Provider.   Preterm labor symptoms and general obstetric precautions including but not limited to vaginal bleeding, contractions, leaking of fluid and fetal movement were reviewed in detail with the patient. Please refer to After Visit Summary for other counseling recommendations.   No follow-ups on file.  Future Appointments  Date Time Provider [place]  05/26/2019 10:10 AM [place] Mall, CNM [facility] None   Marcille Buffy DNP, CNM  05/26/19  8:39 AM

## 2019-05-26 NOTE — Discharge Instructions (Signed)
Signs and Symptoms of Labor Labor is your body's natural process of moving your baby, placenta, and umbilical cord out of your uterus. The process of labor usually starts when your baby is full-term, between 37 and 40 weeks of pregnancy. How will I know when I am close to going into labor? As your body prepares for labor and the birth of your baby, you may notice the following symptoms in the weeks and days before true labor starts:  Having a strong desire to get your home ready to receive your new baby. This is called nesting. Nesting may be a sign that labor is approaching, and it may occur several weeks before birth. Nesting may involve cleaning and organizing your home.  Passing a small amount of thick, bloody mucus out of your vagina (normal bloody show or losing your mucus plug). This may happen more than a week before labor begins, or it might occur right before labor begins as the opening of the cervix starts to widen (dilate). For some women, the entire mucus plug passes at once. For others, smaller portions of the mucus plug may gradually pass over several days.  Your baby moving (dropping) lower in your pelvis to get into position for birth (lightening). When this happens, you may feel more pressure on your bladder and pelvic bone and less pressure on your ribs. This may make it easier to breathe. It may also cause you to need to urinate more often and have problems with bowel movements.  Having "practice contractions" (Braxton Hicks contractions) that occur at irregular (unevenly spaced) intervals that are more than 10 minutes apart. This is also called false labor. False labor contractions are common after exercise or sexual activity, and they will stop if you change position, rest, or drink fluids. These contractions are usually mild and do not get stronger over time. They may feel like: ? A backache or back pain. ? Mild cramps, similar to menstrual cramps. ? Tightening or pressure in  your abdomen. Other early symptoms that labor may be starting soon include:  Nausea or loss of appetite.  Diarrhea.  Having a sudden burst of energy, or feeling very tired.  Mood changes.  Having trouble sleeping. How will I know when labor has begun? Signs that true labor has begun may include:  Having contractions that come at regular (evenly spaced) intervals and increase in intensity. This may feel like more intense tightening or pressure in your abdomen that moves to your back. ? Contractions may also feel like rhythmic pain in your upper thighs or back that comes and goes at regular intervals. ? For first-time mothers, this change in intensity of contractions often occurs at a more gradual pace. ? Women who have given birth before may notice a more rapid progression of contraction changes.  Having a feeling of pressure in the vaginal area.  Your water breaking (rupture of membranes). This is when the sac of fluid that surrounds your baby breaks. When this happens, you will notice fluid leaking from your vagina. This may be clear or blood-tinged. Labor usually starts within 24 hours of your water breaking, but it may take longer to begin. ? Some women notice this as a gush of fluid. ? Others notice that their underwear repeatedly becomes damp. Follow these instructions at home:   When labor starts, or if your water breaks, call your health care provider or nurse care line. Based on your situation, they will determine when you should go in for an   exam.  When you are in early labor, you may be able to rest and manage symptoms at home. Some strategies to try at home include: ? Breathing and relaxation techniques. ? Taking a warm bath or shower. ? Listening to music. ? Using a heating pad on the lower back for pain. If you are directed to use heat:  Place a towel between your skin and the heat source.  Leave the heat on for 20-30 minutes.  Remove the heat if your skin turns  bright red. This is especially important if you are unable to feel pain, heat, or cold. You may have a greater risk of getting burned. Get help right away if:  You have painful, regular contractions that are 5 minutes apart or less.  Labor starts before you are [redacted] weeks along in your pregnancy.  You have a fever.  You have a headache that does not go away.  You have bright red blood coming from your vagina.  You do not feel your baby moving.  You have a sudden onset of: ? Severe headache with vision problems. ? Nausea, vomiting, or diarrhea. ? Chest pain or shortness of breath. These symptoms may be an emergency. If your health care provider recommends that you go to the hospital or birth center where you plan to deliver, do not drive yourself. Have someone else drive you, or call emergency services (911 in the [place] Summary  Labor is your body's natural process of moving your baby, placenta, and umbilical cord out of your uterus.  The process of labor usually starts when your baby is full-term, between 26 and 40 weeks of pregnancy.  When labor starts, or if your water breaks, call your health care provider or nurse care line. Based on your situation, they will determine when you should go in for an exam. This information is not intended to replace advice given to you by your health care provider. Make sure you discuss any questions you have with your health care provider. Document Released: 11/01/2016 Document Revised: 02/24/2017 Document Reviewed: 11/01/2016 Elsevier Patient Education  [place]. Preeclampsia and Eclampsia Preeclampsia is a serious condition that may develop during pregnancy. This condition causes high blood pressure and increased protein in your urine along with other symptoms, such as headaches and vision changes. These symptoms may develop as the condition gets worse. Preeclampsia may occur at 20 weeks of pregnancy or later. Diagnosing and treating  preeclampsia early is very important. If not treated early, it can cause serious problems for you and your baby. One problem it can lead to is eclampsia. Eclampsia is a condition that causes muscle jerking or shaking (convulsions or seizures) and other serious problems for the mother. During pregnancy, delivering your baby may be the best treatment for preeclampsia or eclampsia. For most women, preeclampsia and eclampsia symptoms go away after giving birth. In rare cases, a woman may develop preeclampsia after giving birth (postpartum preeclampsia). This usually occurs within 48 hours after childbirth but may occur up to 6 weeks after giving birth. What are the causes? The cause of preeclampsia is not known. What increases the risk? The following risk factors make you more likely to develop preeclampsia:  Being pregnant for the first time.  Having had preeclampsia during a past pregnancy.  Having a family history of preeclampsia.  Having high blood pressure.  Being pregnant with more than one baby.  Being 60 or older.  Being African-American.  Having kidney disease or diabetes.  Having  medical conditions such as lupus or blood diseases.  Being very overweight (obese). What are the signs or symptoms? The most common symptoms are:  Severe headaches.  Vision problems, such as blurred or double vision.  Abdominal pain, especially upper abdominal pain. Other symptoms that may develop as the condition gets worse include:  Sudden weight gain.  Sudden swelling of the hands, face, legs, and feet.  Severe nausea and vomiting.  Numbness in the face, arms, legs, and feet.  Dizziness.  Urinating less than usual.  Slurred speech.  Convulsions or seizures. How is this diagnosed? There are no screening tests for preeclampsia. Your health care provider will ask you about symptoms and check for signs of preeclampsia during your prenatal visits. You may also have tests that  include:  Checking your blood pressure.  Urine tests to check for protein. Your health care provider will check for this at every prenatal visit.  Blood tests.  Monitoring your baby's heart rate.  Ultrasound. How is this treated? You and your health care provider will determine the treatment approach that is best for you. Treatment may include:  Having more frequent prenatal exams to check for signs of preeclampsia, if you have an increased risk for preeclampsia.  Medicine to lower your blood pressure.  Staying in the hospital, if your condition is severe. There, treatment will focus on controlling your blood pressure and the amount of fluids in your body (fluid retention).  Taking medicine (magnesium sulfate) to prevent seizures. This may be given as an injection or through an IV.  Taking a low-dose aspirin during your pregnancy.  Delivering your baby early. You may have your labor started with medicine (induced), or you may have a cesarean delivery. Follow these instructions at home: Eating and drinking   Drink enough fluid to keep your urine pale yellow.  Avoid caffeine. Lifestyle  Do not use any products that contain nicotine or tobacco, such as cigarettes and e-cigarettes. If you need help quitting, ask your health care provider.  Do not use alcohol or drugs.  Avoid stress as much as possible. Rest and get plenty of sleep. General instructions  Take over-the-counter and prescription medicines only as told by your health care provider.  When lying down, lie on your left side. This keeps pressure off your major blood vessels.  When sitting or lying down, raise (elevate) your feet. Try putting some pillows underneath your lower legs.  Exercise regularly. Ask your health care provider what kinds of exercise are best for you.  Keep all follow-up and prenatal visits as told by your health care provider. This is important. How is this prevented? There is no known way  of preventing preeclampsia or eclampsia from developing. However, to lower your risk of complications and detect problems early:  Get regular prenatal care. Your health care provider may be able to diagnose and treat the condition early.  Maintain a healthy weight. Ask your health care provider for help managing weight gain during pregnancy.  Work with your health care provider to manage any long-term (chronic) health conditions you have, such as diabetes or kidney problems.  You may have tests of your blood pressure and kidney function after giving birth.  Your health care provider may have you take low-dose aspirin during your next pregnancy. Contact a health care provider if:  You have symptoms that your health care provider told you may require more treatment or monitoring, such as: ? Headaches. ? Nausea or vomiting. ? Abdominal pain. ?  Dizziness. ? Light-headedness. Get help right away if:  You have severe: ? Abdominal pain. ? Headaches that do not get better. ? Dizziness. ? Vision problems. ? Confusion. ? Nausea or vomiting.  You have any of the following: ? A seizure. ? Sudden, rapid weight gain. ? Sudden swelling in your hands, ankles, or face. ? Trouble moving any part of your body. ? Numbness in any part of your body. ? Trouble speaking. ? Abnormal bleeding.  You faint. Summary  Preeclampsia is a serious condition that may develop during pregnancy.  This condition causes high blood pressure and increased protein in your urine along with other symptoms, such as headaches and vision changes.  Diagnosing and treating preeclampsia early is very important. If not treated early, it can cause serious problems for you and your baby.  Get help right away if you have symptoms that your health care provider told you to watch for. This information is not intended to replace advice given to you by your health care provider. Make sure you discuss any questions you have  with your health care provider. Document Released: 05/24/2000 Document Revised: 01/27/2018 Document Reviewed: 01/01/2016 Elsevier Patient Education  2020 [employer].

## 2019-05-27 LAB — CULTURE, OB URINE

## 2019-05-27 LAB — OB RESULTS CONSOLE GBS: GBS: POSITIVE

## 2019-05-27 LAB — RPR: RPR Ser Ql: NONREACTIVE

## 2019-05-27 LAB — HIV ANTIBODY (ROUTINE TESTING W REFLEX): HIV Screen 4th Generation wRfx: NONREACTIVE

## 2019-05-28 ENCOUNTER — Other Ambulatory Visit: Payer: Self-pay

## 2019-05-28 ENCOUNTER — Ambulatory Visit (INDEPENDENT_AMBULATORY_CARE_PROVIDER_SITE_OTHER): Payer: Medicaid Other | Admitting: *Deleted

## 2019-05-28 VITALS — BP 125/74 | HR 77 | Temp 98.0°F

## 2019-05-28 DIAGNOSIS — O09292 Supervision of pregnancy with other poor reproductive or obstetric history, second trimester: Secondary | ICD-10-CM

## 2019-05-28 DIAGNOSIS — R030 Elevated blood-pressure reading, without diagnosis of hypertension: Secondary | ICD-10-CM

## 2019-05-28 LAB — CERVICOVAGINAL ANCILLARY ONLY
Bacterial Vaginitis (gardnerella): NEGATIVE
Candida Glabrata: NEGATIVE
Candida Vaginitis: NEGATIVE
Chlamydia: NEGATIVE
Comment: NEGATIVE
Comment: NEGATIVE
Comment: NEGATIVE
Comment: NEGATIVE
Comment: NEGATIVE
Comment: NORMAL
Neisseria Gonorrhea: NEGATIVE
Trichomonas: NEGATIVE

## 2019-05-28 NOTE — Progress Notes (Addendum)
   Subjective:  Diane Ramirez is a 28 y.o. female here for BP check.   Hypertension ROS: taking medications as instructed, no medication side effects noted, no TIA's, no chest pain on exertion, no dyspnea on exertion, no swelling of ankles and no palpitations.    Objective:  BP 125/74 (BP Location: Right Arm, Patient Position: Sitting, Cuff Size: Normal)   Pulse 77   Temp 98 F (36.7 C) (Oral)   LMP 09/13/2018 (Exact Date)   Appearance alert, well appearing, and in no distress, oriented to person, place, and time and normal appearing weight. General exam BP noted to be well controlled today in office.    Assessment:   Blood Pressure well controlled, stable and asymptomatic.   Plan:  Current treatment plan is effective, no change in therapy.  Repeat PrC.  Derl Barrow, RN   Patient seen and assessed by nursing staff during this encounter. I have reviewed the chart and agree with the documentation and plan.  Clarisa Fling, NP 05/29/2019 4:09 PM

## 2019-05-29 LAB — PROTEIN / CREATININE RATIO, URINE
Creatinine, Urine: 181.6 mg/dL
Protein, Ur: 28.9 mg/dL
Protein/Creat Ratio: 159 mg/g creat (ref 0–200)

## 2019-05-30 LAB — CBC
Hematocrit: 33.5 % — ABNORMAL LOW (ref 34.0–46.6)
Hemoglobin: 11.5 g/dL (ref 11.1–15.9)
MCH: 29.6 pg (ref 26.6–33.0)
MCHC: 34.3 g/dL (ref 31.5–35.7)
MCV: 86 fL (ref 79–97)
Platelets: 185 10*3/uL (ref 150–450)
RBC: 3.88 x10E6/uL (ref 3.77–5.28)
RDW: 13.1 % (ref 11.7–15.4)
WBC: 8.1 10*3/uL (ref 3.4–10.8)

## 2019-05-30 LAB — CULTURE, BETA STREP (GROUP B ONLY): Strep Gp B Culture: NEGATIVE

## 2019-06-02 ENCOUNTER — Inpatient Hospital Stay (HOSPITAL_COMMUNITY): Payer: Medicaid Other | Admitting: Anesthesiology

## 2019-06-02 ENCOUNTER — Encounter (HOSPITAL_COMMUNITY): Payer: Self-pay | Admitting: Obstetrics and Gynecology

## 2019-06-02 ENCOUNTER — Inpatient Hospital Stay (HOSPITAL_COMMUNITY): Payer: Medicaid Other

## 2019-06-02 ENCOUNTER — Other Ambulatory Visit: Payer: Self-pay

## 2019-06-02 ENCOUNTER — Ambulatory Visit (INDEPENDENT_AMBULATORY_CARE_PROVIDER_SITE_OTHER): Payer: Medicaid Other | Admitting: Nurse Practitioner

## 2019-06-02 ENCOUNTER — Inpatient Hospital Stay (HOSPITAL_COMMUNITY)
Admission: AD | Admit: 2019-06-02 | Discharge: 2019-06-04 | DRG: 806 | Disposition: A | Discharge status: Home / Self Care | Payer: Medicaid Other | Attending: Family Medicine | Admitting: Family Medicine

## 2019-06-02 VITALS — BP 144/94 | HR 78 | Temp 97.8°F | Wt 157.2 lb

## 2019-06-02 DIAGNOSIS — O4443 Low lying placenta NOS or without hemorrhage, third trimester: Secondary | ICD-10-CM

## 2019-06-02 DIAGNOSIS — Z20828 Contact with and (suspected) exposure to other viral communicable diseases: Secondary | ICD-10-CM | POA: Diagnosis present

## 2019-06-02 DIAGNOSIS — O289 Unspecified abnormal findings on antenatal screening of mother: Secondary | ICD-10-CM | POA: Diagnosis not present

## 2019-06-02 DIAGNOSIS — Z3A37 37 weeks gestation of pregnancy: Secondary | ICD-10-CM | POA: Diagnosis not present

## 2019-06-02 DIAGNOSIS — O26843 Uterine size-date discrepancy, third trimester: Secondary | ICD-10-CM

## 2019-06-02 DIAGNOSIS — O365930 Maternal care for other known or suspected poor fetal growth, third trimester, not applicable or unspecified: Secondary | ICD-10-CM | POA: Diagnosis present

## 2019-06-02 DIAGNOSIS — O99824 Streptococcus B carrier state complicating childbirth: Secondary | ICD-10-CM | POA: Diagnosis present

## 2019-06-02 DIAGNOSIS — O1493 Unspecified pre-eclampsia, third trimester: Secondary | ICD-10-CM | POA: Diagnosis present

## 2019-06-02 DIAGNOSIS — Z30430 Encounter for insertion of intrauterine contraceptive device: Secondary | ICD-10-CM

## 2019-06-02 DIAGNOSIS — O4103X0 Oligohydramnios, third trimester, not applicable or unspecified: Secondary | ICD-10-CM | POA: Diagnosis present

## 2019-06-02 DIAGNOSIS — Z87891 Personal history of nicotine dependence: Secondary | ICD-10-CM

## 2019-06-02 DIAGNOSIS — O0933 Supervision of pregnancy with insufficient antenatal care, third trimester: Secondary | ICD-10-CM

## 2019-06-02 DIAGNOSIS — Z3480 Encounter for supervision of other normal pregnancy, unspecified trimester: Secondary | ICD-10-CM

## 2019-06-02 DIAGNOSIS — Z880 Allergy status to penicillin: Secondary | ICD-10-CM

## 2019-06-02 DIAGNOSIS — O365990 Maternal care for other known or suspected poor fetal growth, unspecified trimester, not applicable or unspecified: Secondary | ICD-10-CM | POA: Diagnosis present

## 2019-06-02 DIAGNOSIS — O1494 Unspecified pre-eclampsia, complicating childbirth: Principal | ICD-10-CM | POA: Diagnosis present

## 2019-06-02 DIAGNOSIS — O09293 Supervision of pregnancy with other poor reproductive or obstetric history, third trimester: Secondary | ICD-10-CM

## 2019-06-02 DIAGNOSIS — Z362 Encounter for other antenatal screening follow-up: Secondary | ICD-10-CM | POA: Diagnosis not present

## 2019-06-02 DIAGNOSIS — O4100X0 Oligohydramnios, unspecified trimester, not applicable or unspecified: Secondary | ICD-10-CM | POA: Diagnosis present

## 2019-06-02 DIAGNOSIS — O4442 Low lying placenta NOS or without hemorrhage, second trimester: Secondary | ICD-10-CM

## 2019-06-02 LAB — SARS CORONAVIRUS 2 (TAT 6-24 HRS): SARS Coronavirus 2: NEGATIVE

## 2019-06-02 LAB — URINALYSIS, ROUTINE W REFLEX MICROSCOPIC
Bilirubin Urine: NEGATIVE
Glucose, UA: NEGATIVE mg/dL
Ketones, ur: NEGATIVE mg/dL
Nitrite: NEGATIVE
Protein, ur: 30 mg/dL — AB
Specific Gravity, Urine: 1.016 (ref 1.005–1.030)
pH: 7 (ref 5.0–8.0)

## 2019-06-02 LAB — COMPREHENSIVE METABOLIC PANEL
ALT: 13 U/L (ref 0–44)
AST: 16 U/L (ref 15–41)
Albumin: 2.7 g/dL — ABNORMAL LOW (ref 3.5–5.0)
Alkaline Phosphatase: 464 U/L — ABNORMAL HIGH (ref 38–126)
Anion gap: 10 (ref 5–15)
BUN: 9 mg/dL (ref 6–20)
CO2: 22 mmol/L (ref 22–32)
Calcium: 8.7 mg/dL — ABNORMAL LOW (ref 8.9–10.3)
Chloride: 106 mmol/L (ref 98–111)
Creatinine, Ser: 0.63 mg/dL (ref 0.44–1.00)
GFR calc Af Amer: >60 mL/min (ref >60)
GFR calc non Af Amer: >60 mL/min (ref >60)
Glucose, Bld: 86 mg/dL (ref 70–99)
Potassium: 3.8 mmol/L (ref 3.5–5.1)
Sodium: 138 mmol/L (ref 135–145)
Total Bilirubin: 0.3 mg/dL (ref 0.3–1.2)
Total Protein: 6.1 g/dL — ABNORMAL LOW (ref 6.5–8.1)

## 2019-06-02 LAB — CBC
HCT: 34.3 % — ABNORMAL LOW (ref 36.0–46.0)
Hemoglobin: 11.6 g/dL — ABNORMAL LOW (ref 12.0–15.0)
MCH: 28.8 pg (ref 26.0–34.0)
MCHC: 33.8 g/dL (ref 30.0–36.0)
MCV: 85.1 fL (ref 80.0–100.0)
Platelets: 188 10*3/uL (ref 150–400)
RBC: 4.03 MIL/uL (ref 3.87–5.11)
RDW: 13.2 % (ref 11.5–15.5)
WBC: 7.9 10*3/uL (ref 4.0–10.5)
nRBC: 0 % (ref 0.0–0.2)

## 2019-06-02 LAB — ABO/RH: ABO/RH(D): O POS

## 2019-06-02 LAB — TYPE AND SCREEN
ABO/RH(D): O POS
Antibody Screen: NEGATIVE

## 2019-06-02 LAB — PROTEIN / CREATININE RATIO, URINE
Creatinine, Urine: 129.09 mg/dL
Protein Creatinine Ratio: 0.32 mg/mg{Cre} — ABNORMAL HIGH (ref 0.00–0.15)
Total Protein, Urine: 41 mg/dL

## 2019-06-02 MED ORDER — ONDANSETRON HCL 4 MG/2ML IJ SOLN: 4.0000 mg | INTRAMUSCULAR | Status: DC | PRN

## 2019-06-02 MED ORDER — COCONUT OIL OIL: 1.0000 "application " | TOPICAL_OIL | Status: DC | PRN

## 2019-06-02 MED ORDER — SOD CITRATE-CITRIC ACID 500-334 MG/5ML PO SOLN: 30.0000 mL | ORAL | Status: DC | PRN

## 2019-06-02 MED ORDER — ZOLPIDEM TARTRATE 5 MG PO TABS: 5.0000 mg | ORAL_TABLET | Freq: Every evening | ORAL | Status: DC | PRN

## 2019-06-02 MED ORDER — ONDANSETRON HCL 4 MG/2ML IJ SOLN: 4.0000 mg | Freq: Four times a day (QID) | INTRAMUSCULAR | Status: DC | PRN

## 2019-06-02 MED ORDER — LEVONORGESTREL 19.5 MCG/DAY IU IUD
INTRAUTERINE_SYSTEM | INTRAUTERINE | Status: AC
  Administered 2019-06-02: 1
  Filled 2019-06-02: qty 1

## 2019-06-02 MED ORDER — LACTATED RINGERS AMNIOINFUSION: INTRAVENOUS | Status: DC

## 2019-06-02 MED ORDER — DIPHENHYDRAMINE HCL 25 MG PO CAPS: 25.0000 mg | ORAL_CAPSULE | Freq: Four times a day (QID) | ORAL | Status: DC | PRN

## 2019-06-02 MED ORDER — PRENATAL MULTIVITAMIN CH
1.0000 | ORAL_TABLET | Freq: Every day | ORAL | Status: DC
  Administered 2019-06-03 – 2019-06-04 (×2): 1 via ORAL
  Filled 2019-06-02 (×2): qty 1

## 2019-06-02 MED ORDER — LACTATED RINGERS IV SOLN: INTRAVENOUS | Status: DC

## 2019-06-02 MED ORDER — SENNOSIDES-DOCUSATE SODIUM 8.6-50 MG PO TABS
2.0000 | ORAL_TABLET | ORAL | Status: DC
  Administered 2019-06-03: 2 via ORAL
  Filled 2019-06-02: qty 2

## 2019-06-02 MED ORDER — OXYTOCIN 40 UNITS IN NORMAL SALINE INFUSION - SIMPLE MED
1.0000 m[IU]/min | INTRAVENOUS | Status: DC
  Administered 2019-06-02: 2 m[IU]/min via INTRAVENOUS
  Filled 2019-06-02: qty 1000

## 2019-06-02 MED ORDER — OXYTOCIN 40 UNITS IN NORMAL SALINE INFUSION - SIMPLE MED: 2.5000 [IU]/h | INTRAVENOUS | Status: DC

## 2019-06-02 MED ORDER — PENICILLIN G POT IN DEXTROSE 60000 UNIT/ML IV SOLN
3.0000 10*6.[IU] | INTRAVENOUS | Status: DC
  Administered 2019-06-02 (×2): 3 10*6.[IU] via INTRAVENOUS
  Filled 2019-06-02 (×2): qty 50

## 2019-06-02 MED ORDER — ONDANSETRON HCL 4 MG PO TABS: 4.0000 mg | ORAL_TABLET | ORAL | Status: DC | PRN

## 2019-06-02 MED ORDER — FENTANYL-BUPIVACAINE-NACL 0.5-0.125-0.9 MG/250ML-% EP SOLN: 12.0000 mL/h | EPIDURAL | Status: DC | PRN

## 2019-06-02 MED ORDER — FENTANYL CITRATE (PF) 100 MCG/2ML IJ SOLN: 100.0000 ug | INTRAMUSCULAR | Status: DC | PRN

## 2019-06-02 MED ORDER — TERBUTALINE SULFATE 1 MG/ML IJ SOLN: 0.2500 mg | Freq: Once | INTRAMUSCULAR | Status: DC | PRN

## 2019-06-02 MED ORDER — PHENYLEPHRINE 40 MCG/ML (10ML) SYRINGE FOR IV PUSH (FOR BLOOD PRESSURE SUPPORT): 80.0000 ug | PREFILLED_SYRINGE | INTRAVENOUS | Status: DC | PRN

## 2019-06-02 MED ORDER — WITCH HAZEL-GLYCERIN EX PADS: 1.0000 "application " | MEDICATED_PAD | CUTANEOUS | Status: DC | PRN

## 2019-06-02 MED ORDER — LIDOCAINE HCL (PF) 1 % IJ SOLN
INTRAMUSCULAR | Status: DC | PRN
  Administered 2019-06-02: 5 mL via EPIDURAL

## 2019-06-02 MED ORDER — DIPHENHYDRAMINE HCL 50 MG/ML IJ SOLN: 12.5000 mg | INTRAMUSCULAR | Status: DC | PRN

## 2019-06-02 MED ORDER — SIMETHICONE 80 MG PO CHEW: 80.0000 mg | CHEWABLE_TABLET | ORAL | Status: DC | PRN

## 2019-06-02 MED ORDER — DIBUCAINE (PERIANAL) 1 % EX OINT: 1.0000 "application " | TOPICAL_OINTMENT | CUTANEOUS | Status: DC | PRN

## 2019-06-02 MED ORDER — IBUPROFEN 600 MG PO TABS
600.0000 mg | ORAL_TABLET | Freq: Four times a day (QID) | ORAL | Status: DC
  Administered 2019-06-03 – 2019-06-04 (×7): 600 mg via ORAL
  Filled 2019-06-02 (×7): qty 1

## 2019-06-02 MED ORDER — OXYTOCIN 40 UNITS IN NORMAL SALINE INFUSION - SIMPLE MED: 1.0000 m[IU]/min | INTRAVENOUS | Status: DC

## 2019-06-02 MED ORDER — OXYTOCIN BOLUS FROM INFUSION
500.0000 mL | Freq: Once | INTRAVENOUS | Status: AC
  Administered 2019-06-02: 500 mL via INTRAVENOUS

## 2019-06-02 MED ORDER — LIDOCAINE HCL (PF) 1 % IJ SOLN: 30.0000 mL | INTRAMUSCULAR | Status: DC | PRN

## 2019-06-02 MED ORDER — LACTATED RINGERS IV SOLN: 500.0000 mL | INTRAVENOUS | Status: DC | PRN

## 2019-06-02 MED ORDER — SODIUM CHLORIDE (PF) 0.9 % IJ SOLN
INTRAMUSCULAR | Status: DC | PRN
  Administered 2019-06-02: 12 mL/h via EPIDURAL

## 2019-06-02 MED ORDER — ACETAMINOPHEN 325 MG PO TABS
650.0000 mg | ORAL_TABLET | ORAL | Status: DC | PRN
  Administered 2019-06-03: 650 mg via ORAL
  Filled 2019-06-02: qty 2

## 2019-06-02 MED ORDER — LACTATED RINGERS IV SOLN: 500.0000 mL | Freq: Once | INTRAVENOUS | Status: DC

## 2019-06-02 MED ORDER — EPHEDRINE 5 MG/ML INJ: 10.0000 mg | INTRAVENOUS | Status: DC | PRN

## 2019-06-02 MED ORDER — OXYCODONE-ACETAMINOPHEN 5-325 MG PO TABS: 1.0000 | ORAL_TABLET | ORAL | Status: DC | PRN

## 2019-06-02 MED ORDER — OXYTOCIN 10 UNIT/ML IJ SOLN: 10.0000 [IU] | Freq: Once | INTRAMUSCULAR | Status: DC | PRN

## 2019-06-02 MED ORDER — BENZOCAINE-MENTHOL 20-0.5 % EX AERO: 1.0000 "application " | INHALATION_SPRAY | CUTANEOUS | Status: DC | PRN

## 2019-06-02 MED ORDER — FENTANYL-BUPIVACAINE-NACL 0.5-0.125-0.9 MG/250ML-% EP SOLN
EPIDURAL | Status: AC
  Filled 2019-06-02: qty 250

## 2019-06-02 MED ORDER — ACETAMINOPHEN 325 MG PO TABS: 650.0000 mg | ORAL_TABLET | ORAL | Status: DC | PRN

## 2019-06-02 MED ORDER — SODIUM CHLORIDE 0.9 % IV SOLN
5.0000 10*6.[IU] | Freq: Once | INTRAVENOUS | Status: AC
  Administered 2019-06-02: 5 10*6.[IU] via INTRAVENOUS
  Filled 2019-06-02: qty 5

## 2019-06-02 MED ORDER — TETANUS-DIPHTH-ACELL PERTUSSIS 5-2.5-18.5 LF-MCG/0.5 IM SUSP: 0.5000 mL | Freq: Once | INTRAMUSCULAR | Status: DC

## 2019-06-02 MED ORDER — OXYCODONE-ACETAMINOPHEN 5-325 MG PO TABS: 2.0000 | ORAL_TABLET | ORAL | Status: DC | PRN

## 2019-06-02 NOTE — Discharge Summary (Signed)
Postpartum Discharge Summary     Patient Name: Diane Ramirez DOB: 1991-04-11 MRN: [id number]  Date of admission: 06/02/2019 Delivering Provider: Laury Deep   Date of discharge: 06/04/2019  Admitting diagnosis: Preeclampsia, third trimester [O14.93] Intrauterine pregnancy: [redacted]w[redacted]d    Secondary diagnosis:  Principal Problem:   SVD (spontaneous vaginal delivery) Active Problems:   Indication for care in labor or delivery   Preeclampsia, third trimester   IUGR (intrauterine growth restriction) affecting care of mother   Oligohydramnios   Limited prenatal care in third trimester   Uterine size date discrepancy pregnancy, third trimester  Additional problems: None     Discharge diagnosis: Term Pregnancy Delivered                                                                                                Post partum procedures: None  Induction: AROM, Pitocin and Foley Balloon  Complications: None  Hospital course:  Induction of Labor With Vaginal Delivery   28y.o. yo G2P1001 at 267w3das admitted to the hospital 06/02/2019 for induction of labor.  Indication for induction: Preeclampsia.  Patient presented to MAU for elevated blood pressures in the office.  Patient found to have PreEclampsia, FGR, and Oligo after evaluation.  Patient sent for induction with foley bulb placed in MAU.  Pitocin started while on unit and AROM after fb expulsion.  Fetal tracing with some variable decelerations that resolved with amnioinfusion.  Patient progressed to complete and delivered without complications or lacerations.   Liletta was placed after delivery of the placenta.   Membrane Rupture Time/Date: 5:01 PM ,06/02/2019   Intrapartum Procedures: Episiotomy: None [1]                                         Lacerations:  None [1]  Patient had delivery of a Viable infant.  Information for the patient's newborn:  CaYona, Kosek0[[id number]]Delivery Method: Vaginal, Spontaneous(Filed from  Delivery Summary)    06/02/2019  Details of delivery can be found in separate delivery note.  Patient had a routine postpartum course. Patient is discharged home 06/04/19. Delivery time: 8:17 PM    Magnesium Sulfate received: No BMZ received: No Rhophylac:N/A MMR:N/A Transfusion:No  Physical exam  Vitals:   06/03/19 1202 06/03/19 1406 06/03/19 2200 06/04/19 0608  BP: 106/65 122/78 123/66 128/83  Pulse: 69 78 77 79  Resp: _0 Temp:  98.7 F (37.1 C) 98.6 F (37 C) 98 F (36.7 C)  TempSrc:  Oral Oral Oral  SpO2:  100%  100%  Weight:      Height:       General: alert, cooperative and no distress Lochia: appropriate Uterine Fundus: firm Incision: N/A DVT Evaluation: No evidence of DVT seen on physical exam. Labs: Lab Results  Component Value Date   WBC 7.9 06/02/2019   HGB 11.6 (L) 06/02/2019   HCT 34.3 (L) 06/02/2019   MCV 85.1 06/02/2019   PLT 188 06/02/2019   CMP  Latest Ref Rng & Units 06/02/2019  Glucose 70 - 99 mg/dL 86  BUN 6 - 20 mg/dL 9  Creatinine 0.44 - 1.00 mg/dL 0.63  Sodium 135 - 145 mmol/L 138  Potassium 3.5 - 5.1 mmol/L 3.8  Chloride 98 - 111 mmol/L 106  CO2 22 - 32 mmol/L 22  Calcium 8.9 - 10.3 mg/dL 8.7(L)  Total Protein 6.5 - 8.1 g/dL 6.1(L)  Total Bilirubin 0.3 - 1.2 mg/dL 0.3  Alkaline Phos 38 - 126 U/L 464(H)  AST 15 - 41 U/L 16  ALT 0 - 44 U/L 13    Discharge instruction: per After Visit Summary and "Baby and Me Booklet".  After visit meds:  Allergies as of 06/04/2019   No Known Allergies     Medication List    STOP taking these medications   aspirin EC 81 MG tablet     TAKE these medications   acetaminophen 325 MG tablet Commonly known as: Tylenol Take 2 tablets (650 mg total) by mouth every 4 (four) hours as needed (for pain scale < 4).   ferrous sulfate 325 (65 FE) MG EC tablet Take 1 tablet (325 mg total) by mouth daily with breakfast.   ibuprofen 600 MG tablet Commonly known as: ADVIL Take 1 tablet (600  mg total) by mouth every 6 (six) hours.   multivitamin-prenatal 27-0.8 MG Tabs tablet Take 1 tablet by mouth daily at 12 noon.       Diet: routine diet  Activity: Advance as tolerated. Pelvic rest for 6 weeks.   Outpatient follow up:6 weeks Follow up Appt:No future appointments. Follow up Visit:   Please schedule this patient for Postpartum visit in: 6 weeks with the following provider: Any provider-Virtual For C/S patients schedule nurse incision check in weeks 2 weeks: N/A High risk pregnancy complicated by: PreEclampsia Delivery mode:  SVD Anticipated Birth Control:  IUD-Placed PP PP Procedures needed: BP check -Virtual Schedule Integrated BH visit: no    Newborn Data: Live born female  Birth Weight:  2195 g APGAR: 9, 9  Newborn Delivery   Birth date/time: 06/02/2019 20:17:00 Delivery type: Vaginal, Spontaneous      Baby Feeding: Bottle Disposition:home with mother   06/04/2019 Merilyn Baba, DO

## 2019-06-02 NOTE — Plan of Care (Signed)
completed

## 2019-06-02 NOTE — Progress Notes (Signed)
Diane Ramirez [id number]  Subjective: Nurse calls and reports patient with variable decelerations and minimal change in cervical exam. Strip and Chart Reviewed.  Objective:  Vitals:   06/02/19 1830 06/02/19 1844 06/02/19 1900 06/02/19 1932  BP: 109/77  101/82 112/71  Pulse: 70  78 78  Resp: 18  18 17   Temp:  98 F (36.7 C)    TempSrc:  Oral    SpO2:      Weight:      Height:        FHR: 135 bpm, Mod Var, +Decels, -Accels UC: Q1-42min  Assessment: IUP at [redacted]w[redacted]d Cat II FT IOL  Plan: -Will start amnioinfusion with 337mL bolus and 150 continuous infusion. -Will continue to monitor. -Anticipate SVD  Milinda Cave, CNM 06/02/2019 6:46 PM   Reassessment (7:47 PM)  -125 bpm, Mod Var, -Decels, +Accels -Ctx Q  1-35min  Cat I FT  -Will continue to monitor  Maryann Conners  MSN, CNM Advanced Practice Provider, [facility] for [facility]

## 2019-06-02 NOTE — Progress Notes (Signed)
    Subjective:  Diane Ramirez is a 28 y.o. G2P1001 at [redacted]w[redacted]d being seen today for ongoing prenatal care.  She is currently monitored for the following issues for this high-risk pregnancy and has Supervision of other normal pregnancy, antepartum; Anemia affecting pregnancy, antepartum; Hx of preeclampsia, prior pregnancy, currently pregnant, second trimester; History of penicillin allergy; GBS bacteriuria; Low-lying placenta in second trimester; Cervical dysplasia; Late prenatal care in second trimester; and Abnormal [employer] Screening on their problem list.  Patient reports no complaints.  Contractions: Irregular. Vag. Bleeding: None.  Movement: Present. Denies leaking of fluid.   Denies headaches, blurred vision and RUQ pain.  The following portions of the patient's history were reviewed and updated as appropriate: allergies, current medications, past family history, past medical history, past social history, past surgical history and problem list. Problem list updated.  Objective:   Vitals:   06/02/19 0832  BP: (!) 144/94  Pulse: 78  Temp: 97.8 F (36.6 C)  Weight: 157 lb 3.2 oz (71.3 kg)    Fetal Status: Fetal Heart Rate (bpm): 134 Fundal Height: 32 cm Movement: Present     General:  Alert, oriented and cooperative. Patient is in no acute distress.  Skin: Skin is warm and dry. No rash noted.   Cardiovascular: Normal heart rate noted  Respiratory: Normal respiratory effort, no problems with respiration noted  Abdomen: Soft, gravid, appropriate for gestational age. Pain/Pressure: Absent     Pelvic:  Cervical exam deferred        Extremities: Normal range of motion.  Edema: None  Mental Status: Normal mood and affect. Normal behavior. Normal judgment and thought content.   Urinalysis:      Assessment and Plan:  Pregnancy: G2P1001 at [redacted]w[redacted]d  1. Supervision of other normal pregnancy, antepartum Had contractions and pain in lower abdomen last night.  Reports good fetal movement  but measuring small today.  Reports last visit was measuring appropriately.  Continuing to take baby aspirin.  Will send to MAU due to elevated BP today and will see in the office on Monday if still undelivered.  Reports called to R. Renato Battles, CNM in MAU.  2. Hx of preeclampsia, prior pregnancy, currently pregnant, third trimester BP elevated today and reports her blood pressures have been normal at home - "126/96".  Did not bring listing of BPs with her to verify.  3. Low-lying placenta in second trimester Has not had another ultrasound to confirm placenta is not low lying  4.  Size less than dates Measuring smaller today.  Needs [place] for EFW and location of placenta.  Term labor symptoms and general obstetric precautions including but not limited to vaginal bleeding, contractions, leaking of fluid and fetal movement were reviewed in detail with the patient. Please refer to After Visit Summary for other counseling recommendations.  Return in about 5 days (around 06/07/2019) for in person ROB.  Earlie Server, RN, MSN, NP-BC Nurse Practitioner, [facility] for [facility], [facility] 06/02/2019 8:55 AM

## 2019-06-02 NOTE — Anesthesia Preprocedure Evaluation (Signed)
Anesthesia Evaluation  Patient identified by MRN, date of birth, ID band Patient awake    Reviewed: Allergy & Precautions, NPO status , Patient's Chart, lab work & pertinent test results  Airway Mallampati: I  TM Distance: >3 FB Neck ROM: Full    Dental no notable dental hx. (+) Teeth Intact   Pulmonary former smoker,    Pulmonary exam normal breath sounds clear to auscultation       Cardiovascular hypertension, negative cardio ROS Normal cardiovascular exam Rhythm:Regular Rate:Normal     Neuro/Psych negative neurological ROS  negative psych ROS   GI/Hepatic negative GI ROS, Neg liver ROS,   Endo/Other  negative endocrine ROS  Renal/GU negative Renal ROS     Musculoskeletal   Abdominal   Peds  Hematology Hgb 11.6 Plt 188   Anesthesia Other Findings   Reproductive/Obstetrics (+) Pregnancy                             Anesthesia Physical Anesthesia Plan  ASA: III  Anesthesia Plan: Epidural   Post-op Pain Management:    Induction:   PONV Risk Score and Plan:   Airway Management Planned:   Additional Equipment:   Intra-op Plan:   Post-operative Plan:   Informed Consent: I have reviewed the patients History and Physical, chart, labs and discussed the procedure including the risks, benefits and alternatives for the proposed anesthesia with the patient or authorized representative who has indicated his/her understanding and acceptance.       Plan Discussed with:   Anesthesia Plan Comments: (37 3/7 wk G2P1 w preeclampsia for LEA)       Anesthesia Quick Evaluation

## 2019-06-02 NOTE — Anesthesia Procedure Notes (Signed)
Epidural Patient location during procedure: OB Start time: 06/02/2019 2:56 PM End time: 06/02/2019 3:08 PM  Staffing Anesthesiologist: Barnet Glasgow, MD Performed: anesthesiologist   Preanesthetic Checklist Completed: patient identified, IV checked, site marked, risks and benefits discussed, surgical consent, monitors and equipment checked, pre-op evaluation and timeout performed  Epidural Patient position: sitting Prep: DuraPrep and site prepped and draped Patient monitoring: continuous pulse ox and blood pressure Approach: midline Location: L3-L4 Injection technique: LOR air  Needle:  Needle type: Tuohy  Needle gauge: 17 G Needle length: 9 cm and 9 Needle insertion depth: 7 cm Catheter type: closed end flexible Catheter size: 19 Gauge Catheter at skin depth: 12 cm Test dose: negative  Assessment Events: blood not aspirated, injection not painful, no injection resistance, no paresthesia and negative IV test  Additional Notes Patient identified. Risks/Benefits/Options discussed with patient including but not limited to bleeding, infection, nerve damage, paralysis, failed block, incomplete pain control, headache, blood pressure changes, nausea, vomiting, reactions to medication both or allergic, itching and postpartum back pain. Confirmed with bedside nurse the patient's most recent platelet count. Confirmed with patient that they are not currently taking any anticoagulation, have any bleeding history or any family history of bleeding disorders. Patient expressed understanding and wished to proceed. All questions were answered. Sterile technique was used throughout the entire procedure. Please see nursing notes for vital signs. Test dose was given through epidural needle and negative prior to continuing to dose epidural or start infusion. Warning signs of high block given to the patient including shortness of breath, tingling/numbness in hands, complete motor block, or any  concerning symptoms with instructions to call for help. Patient was given instructions on fall risk and not to get out of bed. All questions and concerns addressed with instructions to call with any issues. 1 Attempt (S) . Patient tolerated procedure well.

## 2019-06-02 NOTE — MAU Provider Note (Signed)
History     CSN: [id number]  Arrival date and time: 06/02/19 0909   None     Chief Complaint  Patient presents with  . Hypertension   HPI  Ms.  Diane Ramirez is a 28 y.o. year old G52P1001 female at [redacted]w[redacted]d weeks gestation who was sent to MAU for elevated blood pressure in the office this morning. She has been seen at [facility] with limited prenatal care (care begun at 18.3 wks and 2 OB visits since). She has a h/o PEC and is taking bASA for prevention. Per Lilyan Punt, NP, she reported that her BPs at home are "usually 120/91". She was seen in MAU on 05/26/19 for a PEC evaluation. She denies H/A, blurry vision, epigastric pain, increased swelling/weight gain. She reports good (+) FM today.   Past Medical History:  Diagnosis Date  . GBS bacteriuria   . Hypertension   . Pregnancy induced hypertension     Past Surgical History:  Procedure Laterality Date  . NO PAST SURGERIES      Family History  Problem Relation Age of Onset  . Cancer Maternal Grandmother   . Cancer Paternal Grandmother     Social History   Tobacco Use  . Smoking status: Former Smoker    Packs/day: 0.25    Years: 1.00    Pack years: 0.25    Types: Cigars    Quit date: 08/30/2017    Years since quitting: 1.7  . Smokeless tobacco: Never Used  Substance Use Topics  . Alcohol use: Not Currently  . Drug use: Not Currently    Types: Marijuana    Comment: last smoked November    Allergies: No Known Allergies  Medications Prior to Admission  Medication Sig Dispense Refill Last Dose  . aspirin EC 81 MG tablet Take 1 tablet (81 mg total) by mouth daily. 30 tablet 4 06/01/2019 at Unknown time  . ferrous sulfate 325 (65 FE) MG EC tablet Take 1 tablet (325 mg total) by mouth daily with breakfast. 60 tablet 3 06/01/2019 at Unknown time  . Prenatal Vit-Fe Fumarate-FA (MULTIVITAMIN-PRENATAL) 27-0.8 MG TABS tablet Take 1 tablet by mouth daily at 12 noon.   06/01/2019 at Unknown time    Review of Systems   Constitutional: Negative.   HENT: Negative.   Eyes: Negative.   Respiratory: Negative.   Cardiovascular: Negative.   Gastrointestinal: Negative.   Endocrine: Negative.   Genitourinary: Negative.   Musculoskeletal: Negative.   Skin: Negative.   Allergic/Immunologic: Negative.   Neurological: Negative.   Hematological: Negative.   Psychiatric/Behavioral: Negative.    Physical Exam   Patient Vitals for the past 24 hrs:  BP Temp Pulse Resp SpO2 Weight  06/02/19 1100 107/63 -- 77 -- -- --  06/02/19 1045 117/74 -- 79 -- -- --  06/02/19 1030 117/87 -- 78 -- -- --  06/02/19 1024 130/78 -- 66 -- -- --  06/02/19 0930 125/80 -- 70 -- -- --  06/02/19 0929 -- -- -- -- 100 % --  06/02/19 0924 120/78 -- 62 -- 100 % --  06/02/19 0923 120/78 97.8 F (36.6 C) 60 16 100 % --  06/02/19 0918 -- -- -- -- -- 71.2 kg     Physical Exam  Nursing note and vitals reviewed. Constitutional: She is oriented to person, place, and time. She appears well-developed and well-nourished.  HENT:  Head: Normocephalic and atraumatic.  Eyes: Pupils are equal, round, and reactive to light.  Cardiovascular: Normal rate, regular rhythm, normal heart sounds  and intact distal pulses.  Respiratory: Effort normal and breath sounds normal.  GI: Soft. Bowel sounds are normal.  Musculoskeletal:        General: Normal range of motion.     Cervical back: Normal range of motion.  Neurological: She is alert and oriented to person, place, and time. She has normal reflexes.  Skin: Skin is warm and dry.  Psychiatric: She has a normal mood and affect. Her behavior is normal. Judgment and thought content normal.   NST - FHR: 125 bpm / moderate variability / accels present / decels absent / TOCO: Occ UC's   MAU Course  Procedures  Placement of foley balloon catheter for cervical ripening. Induction of labor now. Reassuring FHR tracing with no concerns at present. Warning signs given to patient to include heavy vaginal  bleeding, Rupture of membranes, painful uterine contractions q 5 mins or less, severe abdominal discomfort, decreased fetal movement. Proceed with admission to L&D.  MDM CCUA CBC CMP P/C Ratio T&S RPR Serial BP's  OB MFM Complete U/S  *Consult with Dr. Ernestina Patches @ 1137 - notified of patient's complaints, assessments, lab & U/S results, tx plan admit for IOL - agrees with plan  Gavin Pound, CNM notified of admission   Results for orders placed or performed during the hospital encounter of 06/02/19 (from the past 24 hour(s))  Protein / creatinine ratio, urine     Status: Abnormal   Collection Time: 06/02/19  9:38 AM  Result Value Ref Range   Creatinine, Urine 129.09 mg/dL   Total Protein, Urine 41 mg/dL   Protein Creatinine Ratio 0.32 (H) 0.00 - 0.15 mg/mg[Cre]  Urinalysis, Routine w reflex microscopic     Status: Abnormal   Collection Time: 06/02/19  9:38 AM  Result Value Ref Range   Color, Urine YELLOW YELLOW   APPearance CLOUDY (A) CLEAR   Specific Gravity, Urine 1.016 1.005 - 1.030   pH 7.0 5.0 - 8.0   Glucose, UA NEGATIVE NEGATIVE mg/dL   Hgb urine dipstick SMALL (A) NEGATIVE   Bilirubin Urine NEGATIVE NEGATIVE   Ketones, ur NEGATIVE NEGATIVE mg/dL   Protein, ur 30 (A) NEGATIVE mg/dL   Nitrite NEGATIVE NEGATIVE   Leukocytes,Ua MODERATE (A) NEGATIVE   RBC / HPF 6-10 0 - 5 RBC/hpf   WBC, UA 11-20 0 - 5 WBC/hpf   Bacteria, UA MANY (A) NONE SEEN   Squamous Epithelial / LPF 11-20 0 - 5   Mucus PRESENT   CBC     Status: Abnormal   Collection Time: 06/02/19  9:45 AM  Result Value Ref Range   WBC 7.9 4.0 - 10.5 K/uL   RBC 4.03 3.87 - 5.11 MIL/uL   Hemoglobin 11.6 (L) 12.0 - 15.0 g/dL   HCT 34.3 (L) 36.0 - 46.0 %   MCV 85.1 80.0 - 100.0 fL   MCH 28.8 26.0 - 34.0 pg   MCHC 33.8 30.0 - 36.0 g/dL   RDW 13.2 11.5 - 15.5 %   Platelets 188 150 - 400 K/uL   nRBC 0.0 0.0 - 0.2 %  Comprehensive metabolic panel     Status: Abnormal   Collection Time: 06/02/19  9:45 AM  Result  Value Ref Range   Sodium 138 135 - 145 mmol/L   Potassium 3.8 3.5 - 5.1 mmol/L   Chloride 106 98 - 111 mmol/L   CO2 22 22 - 32 mmol/L   Glucose, Bld 86 70 - 99 mg/dL   BUN 9 6 - 20 mg/dL  Creatinine, Ser 0.63 0.44 - 1.00 mg/dL   Calcium 8.7 (L) 8.9 - 10.3 mg/dL   Total Protein 6.1 (L) 6.5 - 8.1 g/dL   Albumin 2.7 (L) 3.5 - 5.0 g/dL   AST 16 15 - 41 U/L   ALT 13 0 - 44 U/L   Alkaline Phosphatase 464 (H) 38 - 126 U/L   Total Bilirubin 0.3 0.3 - 1.2 mg/dL   GFR calc non Af Amer >60 >60 mL/min   GFR calc Af Amer >60 >60 mL/min   Anion gap 10 5 - 15  Type and screen     Status: None   Collection Time: 06/02/19  9:45 AM  Result Value Ref Range   ABO/RH(D) O POS    Antibody Screen NEG    Sample Expiration      06/05/2019,2359 Performed at [facility], [address], [place], [place] [address]   ABO/Rh     Status: None   Collection Time: 06/02/19  9:51 AM  Result Value Ref Range   ABO/RH(D)      O POS Performed at [facility], [address], [place], [place] [address]       [place] MFM UA CORD DOPPLER/BPP/OB FOLLOW-UP  Result Date: 06/02/2019 ----------------------------------------------------------------------  OBSTETRICS REPORT                       (Signed Final 06/02/2019 12:08 pm) ---------------------------------------------------------------------- Patient Info  ID #:       [id number]                          D.O.B.:  12/24/1990 (28 yrs)  Name:       Diane Ramirez                    Visit Date: 06/02/2019 10:00 am ---------------------------------------------------------------------- Performed By  Performed By:     Marcellina Millin          Ref. Address:     [address]                                                             [place], [place]                                                             [address]  Attending:        Ma Rings MD         Secondary Phy.:   MAU Nursing-  MAU/Triage  Referred By:      Dia SitterOLITTA                [facility]                    Tiago Humphrey CNM                               [facility] ---------------------------------------------------------------------- Orders   #  Description                          Code         Ordered By   1  US MFM OB FOLLOW UP                  [id number]     Ilah Boule   2  US MFM FETAL BPP WO NON              76819.01     Appollonia Klee      STRESS   3  US MFM UA CORD DOPPLER               76820.02     Maleeya Peterkin  ----------------------------------------------------------------------   #  Order #                    Accession #                 Episode #   1  [id number]                  [id number]                  [id number]   2  [id number]                  [id number]                  [id number]   3  [id number]                  [id number]                  [id number]  ---------------------------------------------------------------------- Indications   Size of fetus inconsistent with dates in third O26.843   trimester   Abnormal biochemical screen (intermediate      O28.9   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,   Increased carrier risk for SMA)   [redacted] weeks gestation of pregnancy                Z3A.37   Low lying placenta, antepartum (resolved)      O44.40   Poor obstetric history: Previous               O09.299   preeclampsia / eclampsia/gestational HTN  ---------------------------------------------------------------------- Vital Signs                                                 Height:        5'5" ---------------------------------------------------------------------- Fetal Evaluation  Num Of Fetuses:         1  Fetal Heart Rate(bpm):  135  Cardiac Activity:  Observed  Presentation:           Cephalic  Placenta:               Anterior  P. Cord Insertion:      Visualized  Amniotic Fluid  AFI FV:      Within normal limits  AFI Sum(cm)     %Tile        Largest Pocket(cm)  6.18            < 3         1.97  RUQ(cm)       RLQ(cm)       LUQ(cm)        LLQ(cm)  1             1.42          1.79           1.97 ---------------------------------------------------------------------- Biophysical Evaluation  Amniotic F.V:   Within normal limits       F. Tone:        Observed  F. Movement:    Observed                   Score:          8/8  F. Breathing:   Observed ---------------------------------------------------------------------- Biometry  BPD:      81.4  mm     G. Age:  32w 5d        < 1  %    CI:        71.24   %    70 - 86                                                          FL/HC:      20.4   %    20.8 - 22.6  HC:      307.2  mm     G. Age:  34w 2d        < 1  %    HC/AC:      0.99        0.92 - 1.05  AC:      309.7  mm     G. Age:  34w 6d          7  %    FL/BPD:     77.1   %    71 - 87  FL:       62.8  mm     G. Age:  32w 4d        < 1  %    FL/AC:      20.3   %    20 - 24  Est. FW:    2318  gm      5 lb 2 oz      2  % ---------------------------------------------------------------------- OB History  Gravidity:    2         Term:   1  Living:       1 ---------------------------------------------------------------------- Gestational Age  LMP:           37w 3d        Date:  09/13/18                 EDD:  06/20/19  U/S Today:     33w 4d                                        EDD:   07/17/19  Best:          37w 3d     Det. By:  LMP  (09/13/18)          EDD:   06/20/19 ---------------------------------------------------------------------- Anatomy  Cranium:               Appears normal         Aortic Arch:            Previously seen  Cavum:                 Appears normal         Ductal Arch:            Appears normal  Ventricles:            Appears normal         Diaphragm:              Appears normal  Choroid Plexus:        Appears normal         Stomach:                Appears normal, left                                                                         sided  Cerebellum:            Appears normal         Abdomen:                Appears normal  Posterior Fossa:       Appears normal         Abdominal Wall:         Previously seen  Nuchal Fold:           Previously seen        Cord Vessels:           Appears normal (3                                                                        vessel cord)  Face:                  Orbits and profile     Kidneys:                Appear normal                         previously seen  Lips:                  Appears normal  Bladder:                Appears normal  Thoracic:              Appears normal         Spine:                  Previously seen  Heart:                 Previously seen        Upper Extremities:      Previously seen  RVOT:                  Appears normal         Lower Extremities:      Previously seen  LVOT:                  Previously seen ---------------------------------------------------------------------- Doppler - Fetal Vessels  Umbilical Artery   S/D     %tile                                            ADFV    RDFV  3.79    > 97.5                                               No      No ---------------------------------------------------------------------- Cervix Uterus Adnexa  Cervix  Not visualized (advanced GA >24wks)  Comments:  This patient was seen for an ultrasound today due to  preeclampsia.  The patient was evaluated this morning in the  MAU where her blood pressures were noted to be elevated  and a P/C ratio indicated significant proteinuria.  The overall EFW obtained today measures at the 2nd  percentile for her gestational age, indicating fetal growth  restriction.  The amniotic fluid was also in the lower normal  range with a total AFI of 6.1 cm.  Doppler studies of the umbilical arteries performed due to  fetal growth restriction, showed an elevated S/D ratio of 3.79.  There were no signs of absent or reversed end-diastolic flow.  Due to fetal growth restriction and preeclampsia,  delivery is  recommended at her current gestational age.  The patient will  be admitted for induction today. Ma Rings, MD Electronically Signed Final Report   06/02/2019 12:08 pm    Assessment and Plan  1. Preeclampsia, third trimester 2. Poor fetal growth affecting management of mother in third trimester, single or unspecified fetus 3. Oligohydramnios in third trimester, single or unspecified fetus 4. Limited prenatal care in third trimester  - Admit to L&D for IOL - Cervical Balloon placed in MAU prior to admission - COVID-19 testing done prior to admission  Raelyn Mora, MSN, CNM 06/02/2019, 9:28 AM

## 2019-06-02 NOTE — MAU Note (Signed)
.   Diane Ramirez is a 28 y.o. at [redacted]w[redacted]d here in MAU reporting:she was sent over from office for increase in blood pressure. Denies any pain   Pain score: 0 Vitals:   06/02/19 0923  BP: 120/78  Pulse: 60  Resp: 16  Temp: 97.8 F (36.6 C)  SpO2: 100%     FHT:125 Lab orders placed from triage:

## 2019-06-02 NOTE — Progress Notes (Signed)
Damita Eppard MRN: [id number]  Subjective: -Patient resting in bed.  Reports comfort with epidural. No questions or concerns.  FOB at bedside, supportive.   Objective: BP 111/73   Pulse 67   Temp 97.9 F (36.6 C) (Oral)   Resp 18   Ht 5\' 5"  (1.651 m)   Wt 71.2 kg   LMP 09/13/2018 (Exact Date)   SpO2 99%   BMI 26.13 kg/m  No intake/output data recorded. Total I/O In: 645.3 [I.V.:645.3] Out: -   Fetal Monitoring: FHT: 125 bpm, Mod Var, -Decels, +Accels UC: Q1-35min    Vaginal Exam: SVE:   Dilation: 6 Effacement (%): 50 Station: -3 Exam by:: Gavin Pound CNM Membranes:AROM of small amt clear fluid Internal Monitors: None  Augmentation/Induction: Pitocin:51mUn/min Cytotec: None  Assessment:  IUP at 37.3 weeks Cat I FT  IOL Amniotomy  Plan: -AROM performed without incident. -Patient confirms that she delivered last baby shortly after AROM without incident. -Instructed to report any increased rectal pressure or urge to push.  -Will continue to titrate pitocin as ordered. -Continue other mgmt as ordered. -Anticipate SVD   Riley Churches, CNM Advanced Practice Provider, [facility] 06/02/2019, 5:04 PM

## 2019-06-02 NOTE — H&P (Addendum)
Marland Kitchen OBSTETRIC ADMISSION HISTORY AND PHYSICAL  Diane Ramirez is a 28 y.o. female G2P1001 with IUP at [redacted]w[redacted]d by LMP confirmed w/ 19 wk [place] presenting for IOL due to pre-eclampsia, FGR, oligohydramnios. She has been checking her BP at home but cannot remember what the values have been. She was seen in the office today for routine [facility] and noted to have BP 144/94. She was sent to MAU for evlaution and workup was c/w pre-eclampsia w/ elevated UPC.  She reports +FMs, No LOF, no VB, no blurry vision, headaches or peripheral edema, and RUQ pain.  She plans on breast feeding. 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, and increased risk spinal muscular atrophy FGR Oligohydramnios  Past Medical History: Past Medical History:  Diagnosis Date   GBS bacteriuria    Hypertension    Pregnancy induced hypertension     Past Surgical History: Past Surgical History:  Procedure Laterality Date   NO PAST SURGERIES      Obstetrical History: OB History     Gravida  2   Para  1   Term  1   Preterm  0   AB  0   Living  1      SAB  0   TAB  0   Ectopic  0   Multiple  0   Live Births  1           Social History: Social History   Socioeconomic History   Marital status: Single    Spouse name: Not on file   Number of children: 1   Years of education: Not on file   Highest education level: High school graduate  Occupational History   Not on file  Tobacco Use   Smoking status: Former Smoker    Packs/day: 0.25    Years: 1.00    Pack years: 0.25    Types: Cigars    Quit date: 08/30/2017    Years since quitting: 1.7   Smokeless tobacco: Never Used  Substance and Sexual  Activity   Alcohol use: Not Currently   Drug use: Not Currently    Types: Marijuana    Comment: last smoked November   Sexual activity: Yes    Birth control/protection: Injection    Comment: Depo-weight lose; last injection July 2019  Other Topics Concern   Not on file  Social History Narrative   Not on file   Social Determinants of Health   Financial Resource Strain: Low Risk    Difficulty of Paying Living Expenses: Not hard at all  Food Insecurity: No Food Insecurity   Worried About August 2019 in the Last Year: Never true   Ran Out of Food in the Last Year: Never true  Transportation Needs: No Transportation Needs   Lack of Transportation (Medical): No   Lack of Transportation (Non-Medical): No  Physical Activity: Inactive   Days of Exercise per Week: 0 days   Minutes of Exercise per Session: 0 min  Stress: Stress Concern Present   Feeling of Stress : To some extent  Social Connections: Somewhat Isolated   Frequency of Communication with Friends and Family: More than three times a week   Frequency  of Social Gatherings with Friends and Family: More than three times a week   Attends Religious Services: Never   Database administratorActive Member of Clubs or Organizations: No   Attends Engineer, structuralClub or Organization Meetings: Never   Marital Status: Living with partner    Family History: Family History  Problem Relation Age of Onset   Cancer Maternal Grandmother    Cancer Paternal Grandmother     Allergies: No Known Allergies  Medications Prior to Admission  Medication Sig Dispense Refill Last Dose   aspirin EC 81 MG tablet Take 1 tablet (81 mg total) by mouth daily. 30 tablet 4 06/01/2019 at Unknown time   ferrous sulfate 325 (65 FE) MG EC tablet Take 1 tablet (325 mg total) by mouth daily with breakfast. 60 tablet 3 06/01/2019 at Unknown time   Prenatal Vit-Fe Fumarate-FA (MULTIVITAMIN-PRENATAL) 27-0.8 MG TABS tablet Take 1 tablet by mouth daily at 12 noon.   06/01/2019 at Unknown time      Review of Systems   All systems reviewed and negative except as stated in HPI  Blood pressure 126/82, pulse 63, temperature 98 F (36.7 C), temperature source Oral, resp. rate 18, height 5\' 5"  (1.651 m), weight 71.2 kg, last menstrual period 09/13/2018, SpO2 100 %, unknown if currently breastfeeding. General appearance: alert, cooperative, appears stated age and no distress Lungs: clear to auscultation bilaterally Heart: regular rate and rhythm Abdomen: soft, non-tender; bowel sounds normal Pelvic: deferred, FB placed in MAU Extremities: Homans sign is negative, no sign of DVT Presentation: cephalic confirmed w/ US Fetal monitoringBaseline: 135 bpm, Variability: Good {> 6 bpm), Accelerations: Reactive and Decelerations: Absent Uterine activityFrequency: Every 4 minutes Dilation: 1 Effacement (%): Thick Exam by:: Carloyn Jaeger. Dawson CNM   Prenatal labs: ABO, Rh: --/--/O POS Performed at [facility], 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15 mg/mg[Cre]  Urinalysis, Routine w reflex microscopic   Collection Time: 06/02/19  9:38 AM  Result Value Ref Range   Color, Urine YELLOW YELLOW   APPearance CLOUDY (A) CLEAR   Specific Gravity, Urine 1.016 1.005 - 1.030   pH 7.0 5.0 - 8.0   Glucose, UA NEGATIVE NEGATIVE mg/dL   Hgb urine dipstick SMALL (A) NEGATIVE   Bilirubin Urine NEGATIVE NEGATIVE   Ketones, ur NEGATIVE NEGATIVE mg/dL   Protein, ur 30 (A) NEGATIVE mg/dL   Nitrite NEGATIVE NEGATIVE   Leukocytes,Ua MODERATE (A) NEGATIVE   RBC / HPF 6-10 0 - 5 RBC/hpf   WBC, UA 11-20 0 - 5  WBC/hpf   Bacteria, UA MANY (A) NONE SEEN   Squamous Epithelial / LPF 11-20 0 - 5   Mucus PRESENT   CBC   Collection Time: 06/02/19  9:45 AM  Result Value Ref Range   WBC 7.9 4.0 - 10.5 K/uL   RBC 4.03 3.87 - 5.11 MIL/uL   Hemoglobin 11.6 (L) 12.0 - 15.0 g/dL   HCT 16.1 (L) 09.6 - 04.5 %   MCV 85.1 80.0 - 100.0 fL   MCH 28.8 26.0 - 34.0 pg   MCHC 33.8 30.0 - 36.0 g/dL   RDW 40.9 81.1 - 91.4 %   Platelets 188 150 - 400 K/uL   nRBC 0.0 0.0 - 0.2 %  Comprehensive metabolic panel   Collection Time: 06/02/19  9:45 AM  Result Value Ref Range   Sodium 138 135 - 145 mmol/L   Potassium 3.8 3.5 - 5.1 mmol/L   Chloride 106 98 - 111 mmol/L   CO2 22 22 - 32 mmol/L   Glucose, Bld 86 70 - 99 mg/dL   BUN 9 6 - 20 mg/dL   Creatinine, Ser 7.82 0.44 - 1.00 mg/dL   Calcium 8.7 (L) 8.9 - 10.3 mg/dL   Total Protein 6.1 (L) 6.5 - 8.1 g/dL   Albumin 2.7 (L) 3.5 - 5.0 g/dL   AST 16 15 - 41 U/L   ALT 13 0 - 44 U/L   Alkaline Phosphatase 464 (H) 38 - 126 U/L   Total Bilirubin 0.3 0.3 - 1.2 mg/dL   GFR calc non Af Amer >60 >60 mL/min   GFR calc Af Amer >60 >60 mL/min   Anion gap 10 5 - 15  Type and screen   Collection Time: 06/02/19  9:45 AM  Result Value Ref Range   ABO/RH(D) O POS    Antibody Screen NEG    Sample Expiration       06/05/2019,2359 Performed at [facility], [address], [place], [place] [address]   ABO/Rh   Collection Time: 06/02/19  9:51 AM  Result Value Ref Range   ABO/RH(D)      O POS Performed at [facility], [address] [place], [place] [address]     Patient Active Problem List   Diagnosis Date Noted   Preeclampsia, third trimester 06/02/2019   IUGR (intrauterine growth restriction) affecting care of mother 06/02/2019   Oligohydramnios 06/02/2019   Limited prenatal care in third trimester 06/02/2019   Uterine size date discrepancy pregnancy, third trimester 06/02/2019   Abnormal Horizon Carrier Screening 03/22/2019   History of penicillin allergy 01/28/2019   GBS bacteriuria 01/28/2019   Low-lying placenta in second trimester 01/28/2019   Cervical dysplasia 01/22/2019   Anemia affecting pregnancy, antepartum 01/20/2019   Hx of preeclampsia, prior pregnancy, currently pregnant, second trimester 01/20/2019   Supervision of other normal pregnancy, antepartum 01/19/2019   Late prenatal care in second trimester 01/19/2019   Indication for care in labor or delivery 10/30/2017    Assessment/Plan:  Tinleigh Whitmire is a 28 y.o. G2P1001 at [redacted]w[redacted]d here for IOL due to pre-eclampsia, FGR, oligohydramnios. #Labor:FB placed in MAU. Will start low dose pitocin and uptitrate when FB falls out. #Pain: Epidural upon request #FWB: Cat 1 FHT, cont to monitor #ID: [id number] due to urine culture- dose IV Pen G q4h #MOF: breast #MOC: thinking about LARC but undecided #Circ:  Yes- outpt #Pre-eclampsia: urine Pr/Cr ratio up to 0.32. No severe range BP and asymptomatic. Will induce as above. Cont to  monitor w/ low threshold to start Mg gtt.  #Late PNC: consult SW upon delivery  Demetrius Revel, MD  06/02/2019, 12:56 PM   I confirm that I have verified the information documented in the resident's note and that I have also personally  reviewed  the history, physical exam and all medical decision  making activities of this service and have verified that all service and findings are accurately documented in this student's note.    Gavin Pound, [place] 06/02/2019 4:34 PM

## 2019-06-03 LAB — RPR: RPR Ser Ql: NONREACTIVE

## 2019-06-03 NOTE — Progress Notes (Signed)
Post Partum Day 1 after NSVD on 12/23 Subjective: no complaints, up ad lib, voiding and tolerating PO, small lochia, plans to breastfeed, IUD was placed after delivery Patient denies HA, blurry vision, floating spots, RUQ pain or any other complaint.   Objective: Blood pressure 110/74, pulse 67, temperature 98 F (36.7 C), temperature source Oral, resp. rate 17, height 5\' 5"  (1.651 m), weight 71.2 kg, last menstrual period 09/13/2018, SpO2 100 %, unknown if currently breastfeeding.  Physical Exam:  General: alert, cooperative and no distress Lochia:normal flow Chest: CTAB Heart: RRR no m/r/g Abdomen: +BS, soft, nontender,  Uterine Fundus: firm DVT Evaluation: No evidence of DVT seen on physical exam. Extremities: No  edema  Recent Labs    06/02/19 0945  HGB 11.6*  HCT 34.3*    Assessment/Plan: Plan for discharge tomorrow -Patient's BP has been labile after delivery; occasional several low-normal and 3 mildly elevated diastolics immediately after delivery.  -Pre-e labs were normal; continue to monitor blood pressure and if necessary, D/C home on BP meds.    LOS: 1 day   Mervyn Skeeters [facility] 06/03/2019, 10:35 AM

## 2019-06-03 NOTE — Anesthesia Postprocedure Evaluation (Signed)
Anesthesia Post Note  Patient: Multimedia programmer  Procedure(s) Performed: AN AD HOC LABOR EPIDURAL     Patient location during evaluation: Mother Baby Anesthesia Type: Epidural Level of consciousness: awake and alert Pain management: pain level controlled Vital Signs Assessment: post-procedure vital signs reviewed and stable Respiratory status: spontaneous breathing, nonlabored ventilation and respiratory function stable Cardiovascular status: stable Postop Assessment: no headache, no backache and epidural receding Anesthetic complications: no    Last Vitals:  Vitals:   06/03/19 0500 06/03/19 0822  BP: 111/63 110/74  Pulse: 81 67  Resp: 18 17  Temp: 36.7 C 36.7 C  SpO2:      Last Pain:  Vitals:   06/03/19 0830  TempSrc:   PainSc: 0-No pain   Pain Goal:                   Drucie Opitz

## 2019-06-03 NOTE — Lactation Note (Signed)
This note was copied from a baby's chart. Lactation Consultation Note Baby 69 hrs old. Wt. 4.13 lbs they tried to give bottle, but he wouldn't take it.  Baby sleepy,  LC put 2 t-shirts on baby and swaddled in 2 blankets d/t feet cool. Temp WDL. Reviewed newborn less than 5 lbs baby behavior, feeding habits, STS, strict I&O, supplementing, breast massage, milk storage, supply and demand. LPI information sheet given and reviewed. Mom states understanding. Mom shown how to use DEBP & how to disassemble, clean, & reassemble parts. Mom knows to pump q3h for 15-20 min.Mom encouraged to feed baby 8-12 times/24 hours and with feeding cues. Mom encouraged to waken baby for feeds.  Mom has tubular breast w/everted nipples. Mom has easily expressed colostrum. Collected 5 ml. Baby did open his eyes while trying to give colostrum in bottle. Took all but 1 ml in bottle, then took 1 ml in spoon. Discussed s/sx of low blood sugar.  Encouraged mom to call if baby isn't feeding well.  Lactation brochure given.  Encouraged mom to call for assistance or questions.  Patient Name: Boy Kaylani Fromme [id number] Date: 06/03/2019 Reason for consult: Initial assessment;Infant < 6lbs;Early term 37-38.6wks   Maternal Data Has patient been taught Hand Expression?: Yes Does the patient have breastfeeding experience prior to this delivery?: Yes  Feeding Feeding Type: Breast Milk  LATCH Score Latch: Too sleepy or reluctant, no latch achieved, no sucking elicited.  Audible Swallowing: None  Type of Nipple: Everted at rest and after stimulation  Comfort (Breast/Nipple): Soft / non-tender        Interventions Interventions: Breast feeding basics reviewed;Support pillows;Position options;Skin to skin;Expressed milk;Breast massage;Hand express;Adjust position;Breast compression;DEBP  Lactation Tools Discussed/Used Tools: Pump Breast pump type: Double-Electric Breast Pump WIC Program: Yes Pump Review: Setup,  frequency, and cleaning;Milk Storage Initiated by:: Allayne Stack RN IBCLC Date initiated:: 06/03/19   Consult Status Consult Status: Follow-up Date: 06/03/19(in pm) Follow-up type: In-patient    Corri Delapaz, [place] 06/03/2019, 5:30 AM

## 2019-06-04 MED ORDER — IBUPROFEN 600 MG PO TABS: 600.0000 mg | ORAL_TABLET | Freq: Four times a day (QID) | ORAL | 0 refills | Status: DC

## 2019-06-04 MED ORDER — ACETAMINOPHEN 325 MG PO TABS: 650.0000 mg | ORAL_TABLET | ORAL | 0 refills | Status: DC | PRN

## 2019-06-04 NOTE — Discharge Instructions (Signed)

## 2019-06-04 NOTE — Lactation Note (Signed)
This note was copied from a baby's chart. Lactation Consultation Note  Patient Name: Diane Ramirez [id number] Date: 06/04/2019  Per Peter Congo, RN, Mom does not desire to see lactation prior to discharge.   Diane Ramirez [facility] 06/04/2019, 11:07 AM

## 2019-06-04 NOTE — Progress Notes (Signed)
AVS printed and discharge instructions given to patient. Patient instructed to call for follow up appointment and to pick up prescriptions. Patient verbalized understanding and has no further questions.  

## 2019-06-04 NOTE — Lactation Note (Signed)
This note was copied from a baby's chart. Lactation Consultation Note Attempted to see mom, everyone is sleeping. Will try again later. Patient Name: Diane Ramirez [id number] Date: 06/04/2019     Maternal Data    Feeding Feeding Type: Breast Fed  LATCH Score                   Interventions    Lactation Tools Discussed/Used     Consult Status      Theodoro Kalata 06/04/2019, 12:29 AM

## 2019-06-09 ENCOUNTER — Telehealth: Payer: Self-pay | Admitting: General Practice

## 2019-06-09 ENCOUNTER — Telehealth: Payer: Medicaid Other

## 2019-06-09 NOTE — Telephone Encounter (Signed)
Called pt to check in for appt today and no answer.  Left message for patient to give our office a call.

## 2019-06-10 ENCOUNTER — Encounter: Payer: Medicaid Other | Admitting: Student

## 2019-07-05 NOTE — Telephone Encounter (Signed)
Left voice message for patient to return nurse call. Need to know when patient will return to work after maternity leave.  Clovis Pu, RN

## 2019-07-15 ENCOUNTER — Ambulatory Visit: Payer: Medicaid Other | Admitting: Obstetrics and Gynecology

## 2020-05-09 IMAGING — US US MFM OB DETAIL +14 WK
1 series · 13 of 28 positions shown · non-contrast
Comparison: none

[Series 1: us mfm ob detail +14 wk · 13 of 75 slices shown]
[im 3/75]
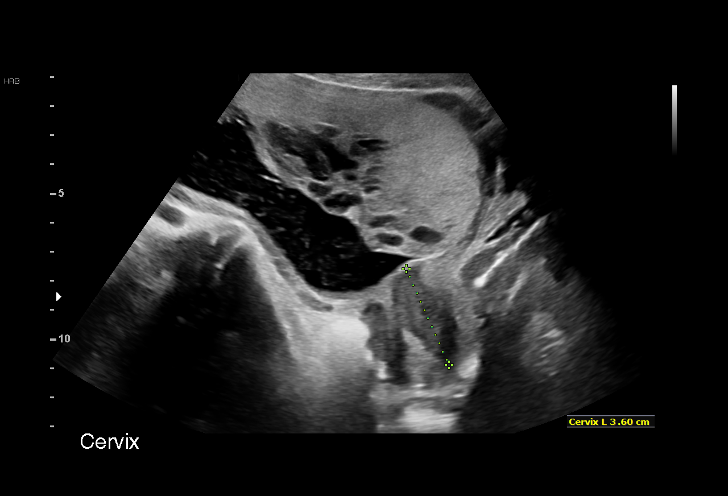
[im 9/75]
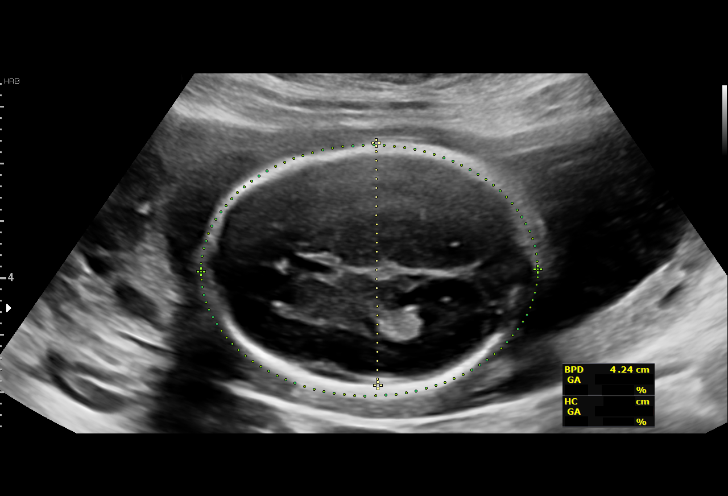
[im 14/75]
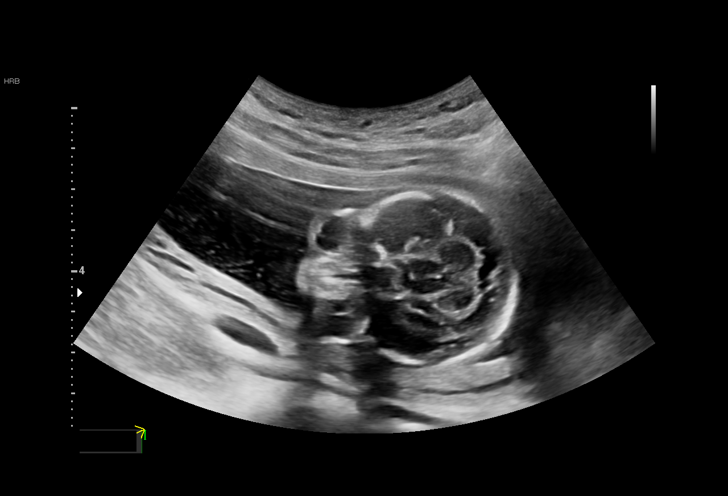
[im 20/75]
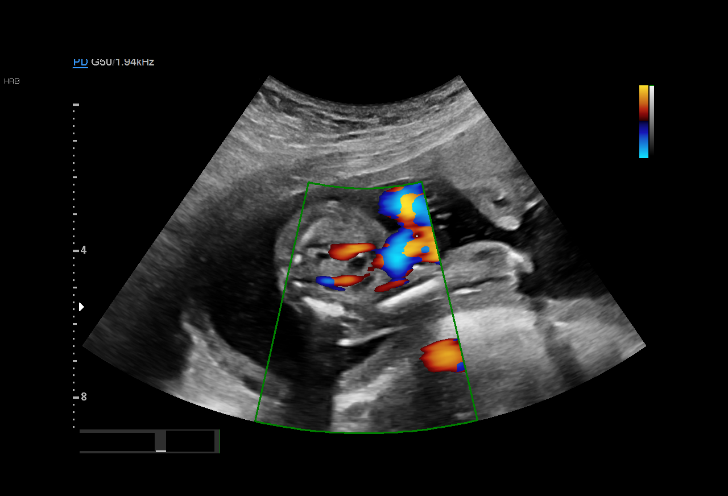
[im 25/75]
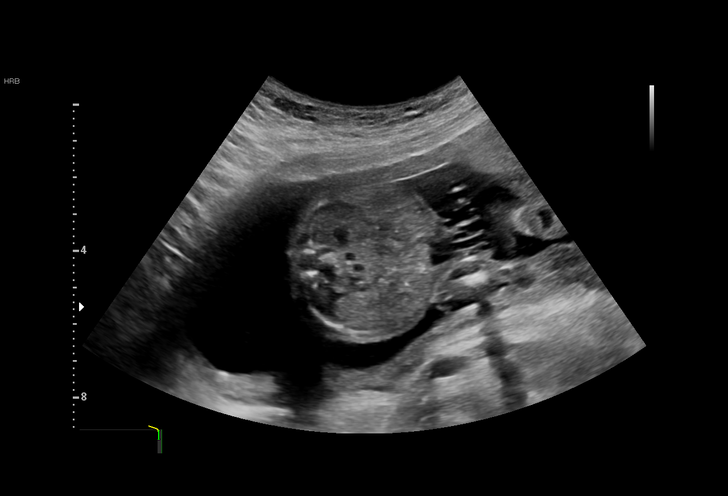
[im 31/75]
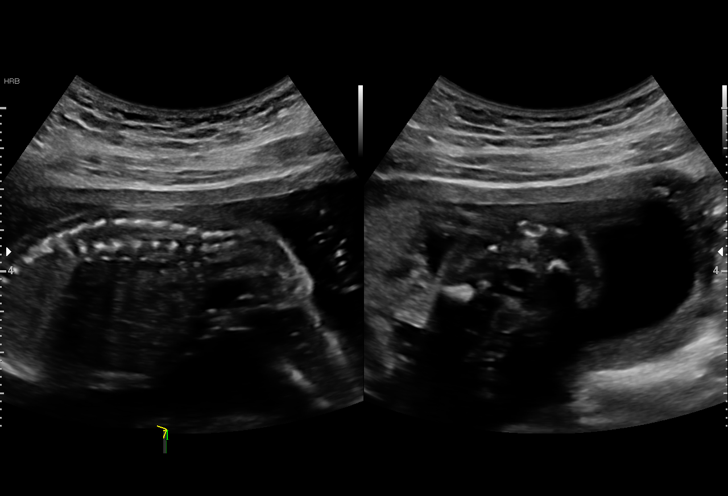
[im 39/75]
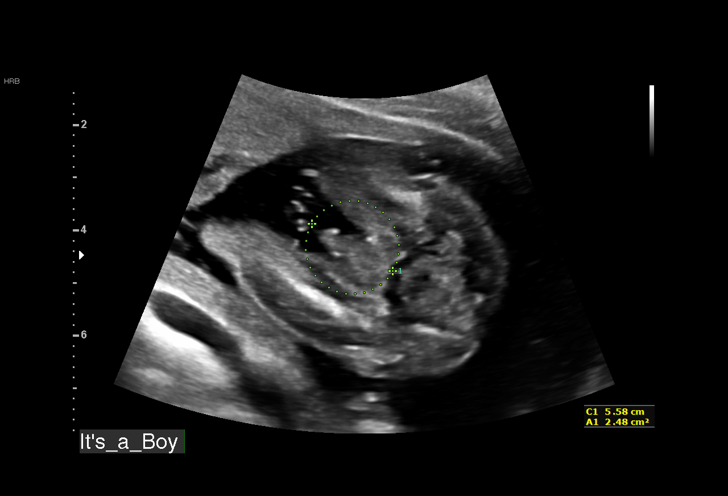
[im 44/75]
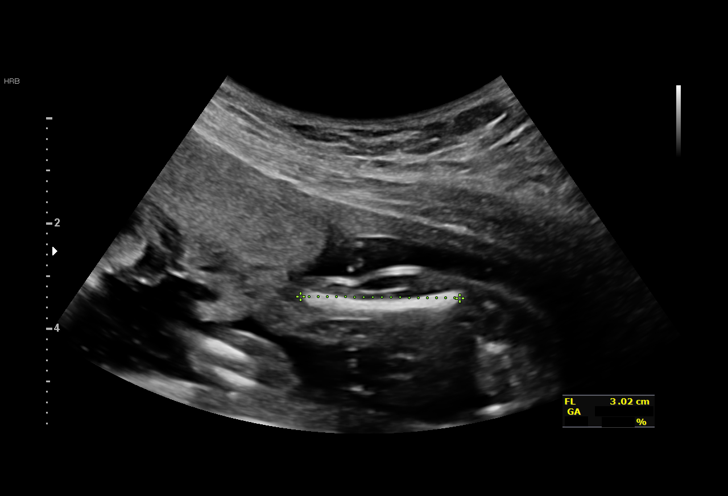
[im 50/75]
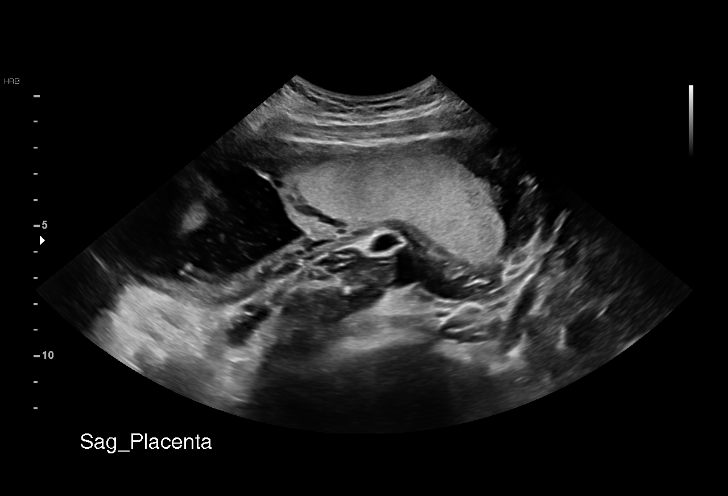
[im 55/75]
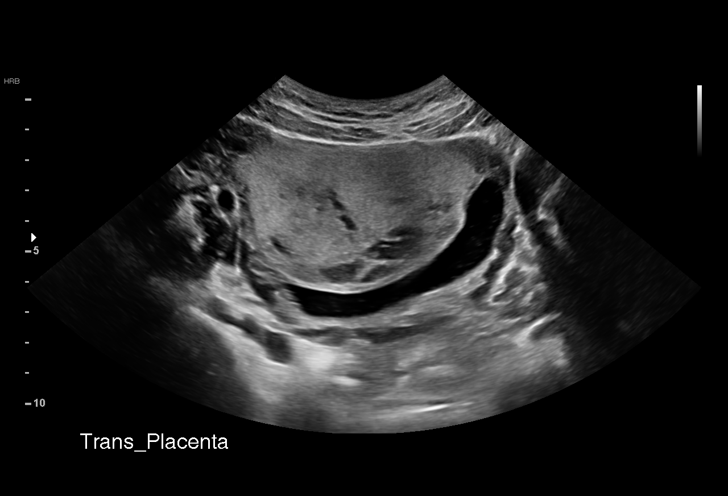
[im 61/75]
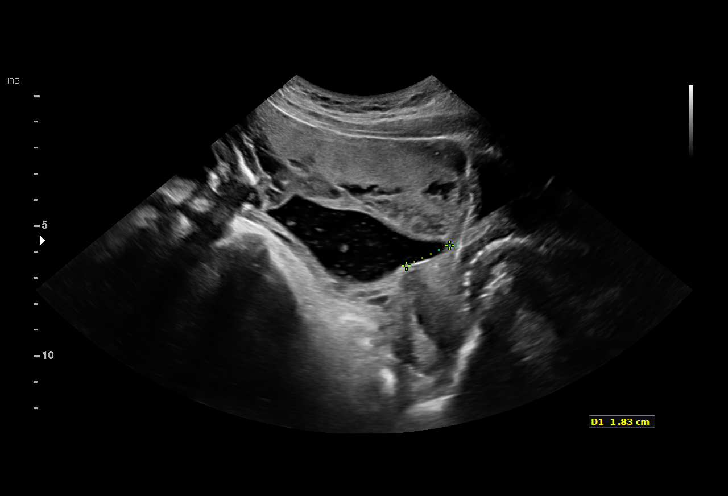
[im 66/75]
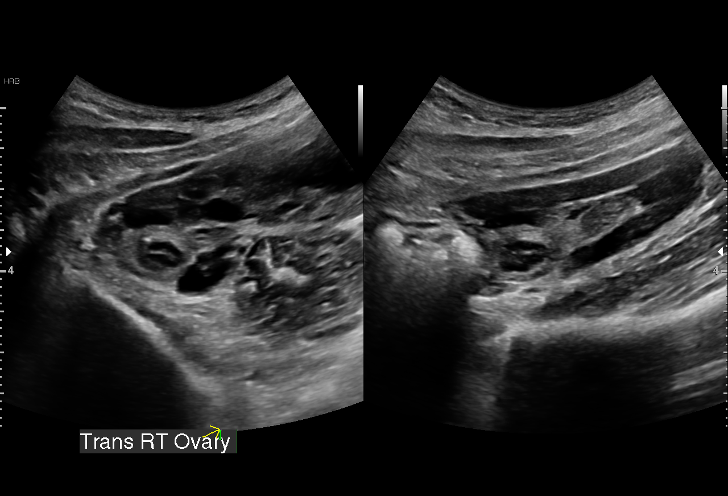
[im 72/75]
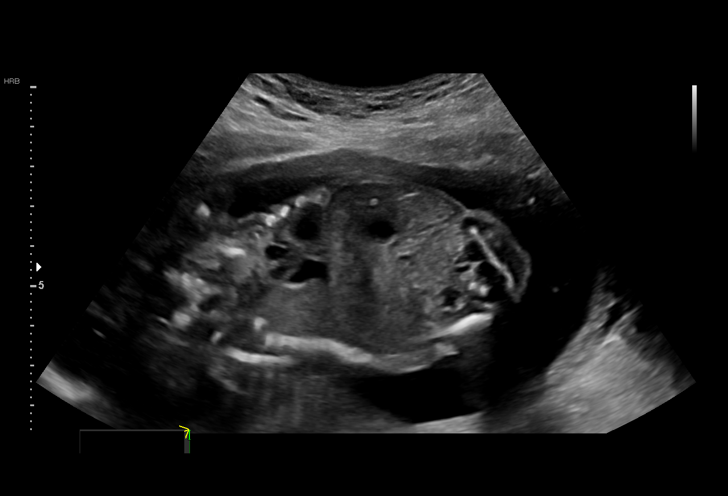

[13 of 28 positions shown; findings below may reference images not displayed]

----------------------------------------------------------------------

 ----------------------------------------------------------------------
Indications

  Low lying placenta, antepartum
  Encounter for antenatal screening for
  malformations (low risk NIPS)
  Anemia during pregnancy in second trimester
  Poor obstetric history: Previous
  preeclampsia / eclampsia/gestational HTN
  Abnormal biochemical screen (intermediate
  allele size detected for Fragile X Syndrome,
  Increased carrier risk for SMA)
  19 weeks gestation of pregnancy
 ----------------------------------------------------------------------
Vital Signs

 BMI:
Fetal Evaluation

 Num Of Fetuses:         1
 Fetal Heart Rate(bpm):  144
 Cardiac Activity:       Observed
 Presentation:           Transverse, head to maternal right
 Placenta:               Low-lying, 1.6cm from int os

 Amniotic Fluid
 AFI FV:      Within normal limits

                             Largest Pocket(cm)

Biometry
 BPD:      42.2  mm     G. Age:  18w 5d         19  %    CI:         68.7   %    70 - 86
                                                         FL/HC:      18.4   %    16.8 -
 HC:      162.7  mm     G. Age:  19w 0d         19  %    HC/AC:      1.09        1.09 -
 AC:      149.1  mm     G. Age:  20w 1d         65  %    FL/BPD:     71.1   %
 FL:         30  mm     G. Age:  19w 2d         31  %    FL/AC:      20.1   %    20 - 24
 CER:      21.1  mm     G. Age:  20w 1d         59  %
 NFT:       4.4  mm
 LV:        5.1  mm
 CM:        4.1  mm

 Est. FW:     305  gm    0 lb 11 oz      50  %
OB History

 Gravidity:    2         Term:   1
 Living:       1
Gestational Age

 LMP:           19w 4d        Date:  09/13/18                 EDD:   06/20/19
 U/S Today:     19w 2d                                        EDD:   06/22/19
 Best:          19w 4d     Det. By:  LMP  (09/13/18)          EDD:   06/20/19
Anatomy

 Cranium:               Appears normal         Aortic Arch:            Appears normal
 Cavum:                 Appears normal         Ductal Arch:            Appears normal
 Ventricles:            Appears normal         Diaphragm:              Appears normal
 Choroid Plexus:        Appears normal         Stomach:                Appears normal, left
                                                                       sided
 Cerebellum:            Appears normal         Abdomen:                Appears normal
 Posterior Fossa:       Appears normal         Abdominal Wall:         Appears nml (cord
                                                                       insert, abd wall)
 Nuchal Fold:           Appears normal         Cord Vessels:           Appears normal (3
                                                                       vessel cord)
 Face:                  Appears normal         Kidneys:                Appear normal
                        (orbits and profile)
 Lips:                  Appears normal         Bladder:                Appears normal
 Thoracic:              Appears normal         Spine:                  Appears normal
 Heart:                 Appears normal         Upper Extremities:      Appears normal
                        (4CH, axis, and
                        situs)
 RVOT:                  Appears normal         Lower Extremities:      Appears normal
 LVOT:                  Appears normal
Cervix Uterus Adnexa

 Cervix
 Length:            3.6  cm.
 Normal appearance by transabdominal scan.

 Uterus
 No abnormality visualized.

 Left Ovary
 Within normal limits.

 Right Ovary
 Within normal limits.
 Cul De Sac
 No free fluid seen.

 Adnexa
 No abnormality visualized.
Impression

 We performed fetal anatomy scan. No makers of
 aneuploidies or fetal structural defects are seen. Fetal
 biometry is consistent with her previously-established dates.
 Amniotic fluid is normal and good fetal activity is seen.
 Patient understands the limitations of ultrasound in detecting
 fetal anomalies.
 Placenta is low lying. Patient does not have vaginal bleeding.
 I reassured her that in a majority of cases, low-lying placenta
 resolves with advancing gestation.

 On cell-free fetal DNA screening, the risks of fetal
 aneuploidies are not increased.

 On carrier screening, she had intermediate allele size for
 fragile-X syndrome and increased risk for spinal muscular
 atrophy.
Recommendations

 -Recommend genetic counseling.
 -An appointment was made for her to return in 8 weeks for
 fetal growth and placental location assessment.
                      Piro, [place]

## 2023-04-09 ENCOUNTER — Telehealth: Payer: Self-pay | Admitting: Physician Assistant

## 2023-04-09 DIAGNOSIS — J019 Acute sinusitis, unspecified: Secondary | ICD-10-CM

## 2023-04-09 DIAGNOSIS — B9689 Other specified bacterial agents as the cause of diseases classified elsewhere: Secondary | ICD-10-CM

## 2023-04-09 MED ORDER — AMOXICILLIN-POT CLAVULANATE 875-125 MG PO TABS: 1.0000 | ORAL_TABLET | Freq: Two times a day (BID) | ORAL | 0 refills | Status: DC

## 2023-04-09 MED ORDER — BENZONATATE 100 MG PO CAPS: 100.0000 mg | ORAL_CAPSULE | Freq: Three times a day (TID) | ORAL | 0 refills | Status: DC | PRN

## 2023-04-09 MED ORDER — FLUTICASONE PROPIONATE 50 MCG/ACT NA SUSP: 2.0000 | Freq: Every day | NASAL | 0 refills | Status: DC

## 2023-04-09 NOTE — Progress Notes (Signed)
Virtual Visit Consent   Diane Ramirez, you are scheduled for a virtual visit with a [facility] provider today. Just as with appointments in the office, your consent must be obtained to participate. Your consent will be active for this visit and any virtual visit you may have with one of our providers in the next 365 days. If you have a MyChart account, a copy of this consent can be sent to you electronically.  As this is a virtual visit, video technology does not allow for your provider to perform a traditional examination. This may limit your provider's ability to fully assess your condition. If your provider identifies any concerns that need to be evaluated in person or the need to arrange testing (such as labs, EKG, etc.), we will make arrangements to do so. Although advances in technology are sophisticated, we cannot ensure that it will always work on either your end or our end. If the connection with a video visit is poor, the visit may have to be switched to a telephone visit. With either a video or telephone visit, we are not always able to ensure that we have a secure connection.  By engaging in this virtual visit, you consent to the provision of healthcare and authorize for your insurance to be billed (if applicable) for the services provided during this visit. Depending on your insurance coverage, you may receive a charge related to this service.  I need to obtain your verbal consent now. Are you willing to proceed with your visit today? Diane Ramirez has provided verbal consent on 04/09/2023 for a virtual visit (video or telephone). Diane Ramirez, [place]  Date: 04/09/2023 7:54 AM  Virtual Visit via Video Note   I, Diane Ramirez, connected with  Tyffani Zimmers  ([id number], 25-Jul-1990) on 04/09/23 at  7:45 AM EDT by a video-enabled telemedicine application and verified that I am speaking with the correct person using two identifiers.  Location: Patient: Virtual Visit Location  Patient: Home Provider: Virtual Visit Location Provider: Home Office   I discussed the limitations of evaluation and management by telemedicine and the availability of in person appointments. The patient expressed understanding and agreed to proceed.    History of Present Illness: Diane Ramirez is a 32 y.o. who identifies as a female who was assigned female at birth, and is being seen today for URI symptoms starting last week with mild nasal congestion and cough. Thought maybe her son brought something home from school although he is feeling fine. Initially got better over weekend. Sunday through today with substantial symptoms reoccurring with nasal and sinus congestion, sinus headache and sinus pain. Notes productive cough with some voice hoarseness. Denies fever, chills, aches. Denies COVID testing.   OTC -- Nyquil.   HPI: HPI  Problems:  Patient Active Problem List   Diagnosis Date Noted   Preeclampsia, third trimester 06/02/2019   IUGR (intrauterine growth restriction) affecting care of mother 06/02/2019   Oligohydramnios 06/02/2019   Limited prenatal care in third trimester 06/02/2019   Uterine size date discrepancy pregnancy, third trimester 06/02/2019   SVD (spontaneous vaginal delivery) 06/02/2019   Abnormal Horizon Carrier Screening 03/22/2019   History of penicillin allergy 01/28/2019   GBS bacteriuria 01/28/2019   Low-lying placenta in second trimester 01/28/2019   Cervical dysplasia 01/22/2019   Anemia affecting pregnancy, antepartum 01/20/2019   Hx of preeclampsia, prior pregnancy, currently pregnant, second trimester 01/20/2019   Supervision of other normal pregnancy, antepartum 01/19/2019   Late prenatal care in second  trimester 01/19/2019   Indication for care in labor or delivery 10/30/2017    Allergies: No Known Allergies Medications:  Current Outpatient Medications:    amoxicillin-clavulanate (AUGMENTIN) 875-125 MG tablet, Take 1 tablet by mouth 2 (two) times  daily., Disp: 14 tablet, Rfl: 0   benzonatate (TESSALON) 100 MG capsule, Take 1 capsule (100 mg total) by mouth 3 (three) times daily as needed for cough., Disp: 30 capsule, Rfl: 0   fluticasone (FLONASE) 50 MCG/ACT nasal spray, Place 2 sprays into both nostrils daily., Disp: 16 g, Rfl: 0   Prenatal Vit-Fe Fumarate-FA (MULTIVITAMIN-PRENATAL) 27-0.8 MG TABS tablet, Take 1 tablet by mouth daily at 12 noon., Disp: , Rfl:   Observations/Objective: Patient is well-developed, well-nourished in no acute distress.  Resting comfortably at home.  Head is normocephalic, atraumatic.  No labored breathing. Speech is clear and coherent with logical content.  Patient is alert and oriented at baseline.   Assessment and Plan: 1. Acute bacterial sinusitis - fluticasone (FLONASE) 50 MCG/ACT nasal spray; Place 2 sprays into both nostrils daily.  Dispense: 16 g; Refill: 0 - benzonatate (TESSALON) 100 MG capsule; Take 1 capsule (100 mg total) by mouth 3 (three) times daily as needed for cough.  Dispense: 30 capsule; Refill: 0 - amoxicillin-clavulanate (AUGMENTIN) 875-125 MG tablet; Take 1 tablet by mouth 2 (two) times daily.  Dispense: 14 tablet; Refill: 0  Rx Augmentin.  Increase fluids.  Rest.  Saline nasal spray.  Probiotic.  Mucinex as directed.  Humidifier in bedroom. Flonase and Tessalon per orders.  Call or return to clinic if symptoms are not improving.   Follow Up Instructions: I discussed the assessment and treatment plan with the patient. The patient was provided an opportunity to ask questions and all were answered. The patient agreed with the plan and demonstrated an understanding of the instructions.  A copy of instructions were sent to the patient via MyChart unless otherwise noted below.   The patient was advised to call back or seek an in-person evaluation if the symptoms worsen or if the condition fails to improve as anticipated.    Diane Climes, PA-C

## 2023-04-09 NOTE — Patient Instructions (Signed)
Diane Ramirez, thank you for joining [name], PA-C for today's virtual visit.  While this provider is not your primary care provider (PCP), if your PCP is located in our provider database this encounter information will be shared with them immediately following your visit.   [facility] MyChart account gives you access to today's visit and all your visits, tests, and labs performed at [facility] " click here if you don't have a [facility] MyChart account or go to [email or web address]  Consent: (Patient) Diane Ramirez provided verbal consent for this virtual visit at the beginning of the encounter.  Current Medications:  Current Outpatient Medications:    acetaminophen (TYLENOL) 325 MG tablet, Take 2 tablets (650 mg total) by mouth every 4 (four) hours as needed (for pain scale < 4)., Disp: 30 tablet, Rfl: 0   ferrous sulfate 325 (65 FE) MG EC tablet, Take 1 tablet (325 mg total) by mouth daily with breakfast., Disp: 60 tablet, Rfl: 3   ibuprofen (ADVIL) 600 MG tablet, Take 1 tablet (600 mg total) by mouth every 6 (six) hours., Disp: 30 tablet, Rfl: 0   Prenatal Vit-Fe Fumarate-FA (MULTIVITAMIN-PRENATAL) 27-0.8 MG TABS tablet, Take 1 tablet by mouth daily at 12 noon., Disp: , Rfl:    Medications ordered in this encounter:  No orders of the defined types were placed in this encounter.    *If you need refills on other medications prior to your next appointment, please contact your pharmacy*  Follow-Up: Call back or seek an in-person evaluation if the symptoms worsen or if the condition fails to improve as anticipated.  [facility] [phone]  Other Instructions Please take antibiotic as directed.  Increase fluid intake.  Use Saline nasal spray.  Take a daily multivitamin. Use the Flonase and .  Place a humidifier in the bedroom.  Please call or return clinic if symptoms are not improving.  Sinusitis Sinusitis is redness, soreness, and  swelling (inflammation) of the paranasal sinuses. Paranasal sinuses are air pockets within the bones of your face (beneath the eyes, the middle of the forehead, or above the eyes). In healthy paranasal sinuses, mucus is able to drain out, and air is able to circulate through them by way of your nose. However, when your paranasal sinuses are inflamed, mucus and air can become trapped. This can allow bacteria and other germs to grow and cause infection. Sinusitis can develop quickly and last only a short time (acute) or continue over a long period (chronic). Sinusitis that lasts for more than 12 weeks is considered chronic.  CAUSES  Causes of sinusitis include: Allergies. Structural abnormalities, such as displacement of the cartilage that separates your nostrils (deviated septum), which can decrease the air flow through your nose and sinuses and affect sinus drainage. Functional abnormalities, such as when the small hairs (cilia) that line your sinuses and help remove mucus do not work properly or are not present. SYMPTOMS  Symptoms of acute and chronic sinusitis are the same. The primary symptoms are pain and pressure around the affected sinuses. Other symptoms include: Upper toothache. Earache. Headache. Bad breath. Decreased sense of smell and taste. A cough, which worsens when you are lying flat. Fatigue. Fever. Thick drainage from your nose, which often is green and may contain pus (purulent). Swelling and warmth over the affected sinuses. DIAGNOSIS  Your caregiver will perform a physical exam. During the exam, your caregiver may: Look in your nose for signs of abnormal growths in your nostrils (  nasal polyps). Tap over the affected sinus to check for signs of infection. View the inside of your sinuses (endoscopy) with a special imaging device with a light attached (endoscope), which is inserted into your sinuses. If your caregiver suspects that you have chronic sinusitis, one or more of  the following tests may be recommended: Allergy tests. Nasal culture A sample of mucus is taken from your nose and sent to a lab and screened for bacteria. Nasal cytology A sample of mucus is taken from your nose and examined by your caregiver to determine if your sinusitis is related to an allergy. TREATMENT  Most cases of acute sinusitis are related to a viral infection and will resolve on their own within 10 days. Sometimes medicines are prescribed to help relieve symptoms (pain medicine, decongestants, nasal steroid sprays, or saline sprays).  However, for sinusitis related to a bacterial infection, your caregiver will prescribe antibiotic medicines. These are medicines that will help kill the bacteria causing the infection.  Rarely, sinusitis is caused by a fungal infection. In theses cases, your caregiver will prescribe antifungal medicine. For some cases of chronic sinusitis, surgery is needed. Generally, these are cases in which sinusitis recurs more than 3 times per year, despite other treatments. HOME CARE INSTRUCTIONS  Drink plenty of water. Water helps thin the mucus so your sinuses can drain more easily. Use a humidifier. Inhale steam 3 to 4 times a day (for example, sit in the bathroom with the shower running). Apply a warm, moist washcloth to your face 3 to 4 times a day, or as directed by your caregiver. Use saline nasal sprays to help moisten and clean your sinuses. Take over-the-counter or prescription medicines for pain, discomfort, or fever only as directed by your caregiver. SEEK IMMEDIATE MEDICAL CARE IF: You have increasing pain or severe headaches. You have nausea, vomiting, or drowsiness. You have swelling around your face. You have vision problems. You have a stiff neck. You have difficulty breathing. MAKE SURE YOU:  Understand these instructions. Will watch your condition. Will get help right away if you are not doing well or get worse. Document Released:  05/27/2005 Document Revised: 08/19/2011 Document Reviewed: 06/11/2011 [facility] Patient Information 2014 [place], [place].    If you have been instructed to have an in-person evaluation today at a local Urgent Care facility, please use the link below. It will take you to a list of all of our available [facility], including address, phone number and hours of operation. Please do not delay care.  [facility]  If you or a family member do not have a primary care provider, use the link below to schedule a visit and establish care. When you choose a [facility] primary care physician or advanced practice provider, you gain a long-term partner in health. Find a Primary Care Provider  Learn more about [facility]'s in-office and virtual care options: [facility] - Get Care Now

## 2023-05-01 ENCOUNTER — Encounter: Payer: Self-pay | Admitting: Family

## 2023-05-01 NOTE — Progress Notes (Signed)
Erroneous encounter-disregard

## 2023-05-07 ENCOUNTER — Encounter: Payer: Self-pay | Admitting: Endocrinology

## 2023-05-07 ENCOUNTER — Encounter: Payer: Self-pay | Admitting: General Practice

## 2023-05-07 ENCOUNTER — Ambulatory Visit: Payer: Self-pay | Admitting: Physician Assistant

## 2023-05-19 ENCOUNTER — Telehealth: Payer: Self-pay | Admitting: Physician Assistant

## 2023-05-19 DIAGNOSIS — J029 Acute pharyngitis, unspecified: Secondary | ICD-10-CM

## 2023-05-19 DIAGNOSIS — R21 Rash and other nonspecific skin eruption: Secondary | ICD-10-CM

## 2023-05-19 NOTE — Progress Notes (Signed)
Because of having a sore throat and a rash, I feel your condition warrants further evaluation and I recommend that you be seen in a face to face visit.    NOTE: There will be NO CHARGE for this eVisit   If you are having a true medical emergency please call 911.      For an urgent face to face visit, [facility] has eight urgent care centers for your convenience:   NEW!! [facility]gent Care Center at [facility] Get Driving Directions [phone] [address], [address] [place], [address]    [facility] Health Urgent Care Center at [facility] Get Driving Directions [phone] [address] [place], [place] [address]   [facility] Health Urgent Care Center [facility]) Get Driving Directions [phone] [address] [place], [place] [address]  [facility] Health Urgent Care Center [facility]) Get Driving Directions [phone] [address] [place],  [place]  [address]  [facility] Health Urgent Care Center [facility] - at [employer]  [phone] [address] [place],  [place] [address]   [facility] Health Urgent Care at [facility] Get Driving Directions [phone] [address] [place] [address] [place], [place] [address]   [facility] Health Urgent Care at [facility] Get Driving Directions  [phone] [address] [place], [place] [address]   [facility] at [facility] Directions [phone] [address] [place], [place] [address]  Your MyChart E-visit questionnaire answers were reviewed by a board certified advanced clinical practitioner to complete your personal care plan based on your specific symptoms.  Thank you for using e-Visits.    I have spent 5 minutes in review of e-visit questionnaire, review and updating patient chart, medical decision making and response to patient.   Margaretann Loveless, PA-C

## 2023-05-20 ENCOUNTER — Telehealth: Payer: Self-pay | Admitting: Physician Assistant

## 2023-05-20 DIAGNOSIS — B084 Enteroviral vesicular stomatitis with exanthem: Secondary | ICD-10-CM

## 2023-05-20 DIAGNOSIS — L301 Dyshidrosis [pompholyx]: Secondary | ICD-10-CM

## 2023-05-20 MED ORDER — TRIAMCINOLONE ACETONIDE 0.1 % EX CREA: 1.0000 | TOPICAL_CREAM | Freq: Two times a day (BID) | CUTANEOUS | 0 refills | Status: AC

## 2023-05-20 NOTE — Progress Notes (Signed)
Virtual Visit Consent   Diane Ramirez, you are scheduled for a virtual visit with a [facility] provider today. Just as with appointments in the office, your consent must be obtained to participate. Your consent will be active for this visit and any virtual visit you may have with one of our providers in the next 365 days. If you have a MyChart account, a copy of this consent can be sent to you electronically.  As this is a virtual visit, video technology does not allow for your provider to perform a traditional examination. This may limit your provider's ability to fully assess your condition. If your provider identifies any concerns that need to be evaluated in person or the need to arrange testing (such as labs, EKG, etc.), we will make arrangements to do so. Although advances in technology are sophisticated, we cannot ensure that it will always work on either your end or our end. If the connection with a video visit is poor, the visit may have to be switched to a telephone visit. With either a video or telephone visit, we are not always able to ensure that we have a secure connection.  By engaging in this virtual visit, you consent to the provision of healthcare and authorize for your insurance to be billed (if applicable) for the services provided during this visit. Depending on your insurance coverage, you may receive a charge related to this service.  I need to obtain your verbal consent now. Are you willing to proceed with your visit today? Diane Ramirez has provided verbal consent on 05/20/2023 for a virtual visit (video or telephone). Diane Ramirez, [place]  Date: 05/20/2023 9:00 AM  Virtual Visit via Video Note   I, Diane Ramirez, connected with  Diane Ramirez  ([id number], Jul 12, 1990) on 05/20/23 at  7:45 AM EST by a video-enabled telemedicine application and verified that I am speaking with the correct person using two identifiers.  Location: Patient: Virtual Visit Location  Patient: Home Provider: Virtual Visit Location Provider: Home Office   I discussed the limitations of evaluation and management by telemedicine and the availability of in person appointments. The patient expressed understanding and agreed to proceed.    History of Present Illness: Diane Ramirez is a 32 y.o. who identifies as a female who was assigned female at birth, and is being seen today for a couple of issues.   Firstly, has dealt with itchy bumps on her hands, more predominant on the L but now bilaterally over the past month. Similar bumps on the back of her fingers and between them. This are constantly itchy but sometimes uncomfortable. Notes washing her hands excessively at work.  Over the past few days noting feeling under the weather with sore throat, headache, fatigue and new-onset blisters on not just her hands but her feet as well. Not noting blisters in mouth yet. Some decreased appetite. No fever at present. Daughter recently with HFMD.   HPI: HPI  Problems:  Patient Active Problem List   Diagnosis Date Noted   Preeclampsia, third trimester 06/02/2019   IUGR (intrauterine growth restriction) affecting care of mother 06/02/2019   Oligohydramnios 06/02/2019   Limited prenatal care in third trimester 06/02/2019   Uterine size date discrepancy pregnancy, third trimester 06/02/2019   SVD (spontaneous vaginal delivery) 06/02/2019   Abnormal Horizon Carrier Screening 03/22/2019   History of penicillin allergy 01/28/2019   GBS bacteriuria 01/28/2019   Low-lying placenta in second trimester 01/28/2019   Cervical dysplasia 01/22/2019   Anemia  affecting pregnancy, antepartum 01/20/2019   Hx of preeclampsia, prior pregnancy, currently pregnant, second trimester 01/20/2019   Supervision of other normal pregnancy, antepartum 01/19/2019   Late prenatal care in second trimester 01/19/2019   Indication for care in labor or delivery 10/30/2017    Allergies: No Known  Allergies Medications:  Current Outpatient Medications:    triamcinolone cream (KENALOG) 0.1 %, Apply 1 Application topically 2 (two) times daily., Disp: 30 g, Rfl: 0   Prenatal Vit-Fe Fumarate-FA (MULTIVITAMIN-PRENATAL) 27-0.8 MG TABS tablet, Take 1 tablet by mouth daily at 12 noon., Disp: , Rfl:   Observations/Objective: Patient is well-developed, well-nourished in no acute distress.  Resting comfortably  at home.  Head is normocephalic, atraumatic.  No labored breathing.  Speech is clear and coherent with logical content.  Patient is alert and oriented at baseline.  Small papular lesions over the hands, palmar and dorsal with lesions between the fingers. Some evidence of scarring from prior excoriation.  There are also vesicular lesions noted, more prominent at the base of the palm. Similar lesions of feet.  Assessment and Plan: 1. Hand, foot and mouth disease  Known exposure. Classic start of symptoms minus fever. Supportive measures and OTC medications reviewed in detail. Quarantine discussed and work note provided. She is aware that if OTC measures are not controlling the pain from lesions or any worsening throat pain, difficulty eating, she is to let us know ASAP.   2. Dyshidrotic eczema - triamcinolone cream (KENALOG) 0.1 %; Apply 1 Application topically 2 (two) times daily.  Dispense: 30 g; Refill: 0  Ongoing. Discussed change in hand washing to try to use warm instead of hot water, gentle soaps. Rubber gloves when possible. Kenalog per orders. Follow-up with PCP for ongoing management if not resolving.   Follow Up Instructions: I discussed the assessment and treatment plan with the patient. The patient was provided an opportunity to ask questions and all were answered. The patient agreed with the plan and demonstrated an understanding of the instructions.  A copy of instructions were sent to the patient via MyChart unless otherwise noted below.   The patient was advised to call  back or seek an in-person evaluation if the symptoms worsen or if the condition fails to improve as anticipated.    Diane Climes, PA-C

## 2023-05-20 NOTE — Patient Instructions (Signed)
Diane Ramirez, thank you for joining [name], PA-C for today's virtual visit.  While this provider is not your primary care provider (PCP), if your PCP is located in our provider database this encounter information will be shared with them immediately following your visit.   A [facility] MyChart account gives you access to today's visit and all your visits, tests, and labs performed at [facility] " click here if you don't have a [facility] MyChart account or go to [email or web address]  Consent: (Patient) Diane Ramirez provided verbal consent for this virtual visit at the beginning of the encounter.  Current Medications:  Current Outpatient Medications:    triamcinolone cream (KENALOG) 0.1 %, Apply 1 Application topically 2 (two) times daily., Disp: 30 g, Rfl: 0   Prenatal Vit-Fe Fumarate-FA (MULTIVITAMIN-PRENATAL) 27-0.8 MG TABS tablet, Take 1 tablet by mouth daily at 12 noon., Disp: , Rfl:    Medications ordered in this encounter:  Meds ordered this encounter  Medications   triamcinolone cream (KENALOG) 0.1 %    Sig: Apply 1 Application topically 2 (two) times daily.    Dispense:  30 g    Refill:  0    Order Specific Question:   Supervising Provider    Answer:   Merrilee Jansky [id number]     *If you need refills on other medications prior to your next appointment, please contact your pharmacy*  Follow-Up: Call back or seek an in-person evaluation if the symptoms worsen or if the condition fails to improve as anticipated.  [facility] [phone]  Other Instructions Hand, Foot, and Mouth Disease, Adult Hand, foot, and mouth disease is an illness caused by a germ called a virus. It mostly happens in children younger than 45 years old. But older children and adults can get it too. It can cause: Sores in your mouth. A rash on your hands and feet. Most people get better within 1-2 weeks. What are the causes? Hand, foot, and mouth  disease is contagious. That means it spreads easily from person to person. You may get it through contact with: The snot, spit, or poop of an infected person. A surface that has the germs on it. You're most contagious during the first week that you're sick. What are the signs or symptoms? Small sores in your mouth. These may hurt. A rash on your hands and feet. Sometimes, you may also get a rash on your butt, arms, legs, or other parts of your body. The rash may look like small red bumps or sores. The bumps may blister. Fever. Sore throat. Body aches or headaches. Not feeling as hungry. How is this diagnosed? Hand, foot, and mouth disease may be diagnosed with an exam. Your health care provider will look at the rash and mouth sores. In some cases, a poop sample or a swab of your throat may be taken. How is this treated? In most cases, no treatment is needed. But your provider may suggest: Medicines to help with pain and fever. A mouth rinse. This may help with pain. Follow these instructions at home: Managing pain and discomfort  Gargle with a mixture of salt and water 3-4 times a day or as needed. To make salt water, completely dissolve -1 tsp (3-6 g) of salt in 1 cup (237 mL) of warm water. To help with pain while eating: Eat soft foods. Stay away from foods and drinks that are salty or spicy. Stay away from foods and drinks that have acid  in them, such as pickles and orange juice. Avoid alcohol. Eat cold food and drinks. These include water, milk, milkshakes, frozen ice pops, slushies, sherbets, and low-calorie sports drinks. Relieving itching Keep cool and out of the sun. Sweating and feeling hot can make itching worse. Cool baths can help. Try adding baking soda or dry oatmeal to the water. Do not bathe in hot water. Put cold, wet cloths called cold compresses on itchy spots, as told by your provider. Use calamine lotion as told by your provider. This is a lotion you can get  at the store to help with itching. Make sure you don't scratch or pick at your rash. To help stop scratching: Keep your fingernails clean and cut short. Wear soft gloves or mittens when you sleep. General instructions  Take or apply over-the-counter and prescription medicines only as told by your provider. Wash your hands often with soap and water for at least 20 seconds. If soap and water aren't available, use hand sanitizer. Clean surfaces and shared items that you touch a lot. Stay away from work, schools, and other groups for a few days or until your fever is gone for at least 24 hours. Return to your normal activities as told by your provider. Ask your provider what activities are safe for you. Contact a health care provider if: Your symptoms get worse. Your symptoms don't get better within 2 weeks. Your pain doesn't get better with medicine. You have trouble swallowing. You get sores or blisters on your lips or outside your mouth. You have a fever for more than 3 days. Get help right away if: You show signs of very bad dehydration. Signs include: Peeing only very small amounts or peeing less than 3 times in 24 hours. Pee (urine) that's very dark. Dry mouth, tongue, or lips. Fewer tears or sunken eyes. Dry skin. Fast breathing. Not being active or being very sleepy. Pale skin. Fingertips that take more than 2 seconds to turn pink again after a gentle squeeze. Weight loss. You have a severe headache. You have a stiff neck. You start to act in a way that isn't normal. You have chest pain or trouble breathing. These symptoms may be an emergency. Get help right away. Call 911. Do not wait to see if the symptoms will go away. Do not drive yourself to the hospital. This information is not intended to replace advice given to you by your health care provider. Make sure you discuss any questions you have with your health care provider. Document Revised: 08/22/2022 Document Reviewed:  08/22/2022 Elsevier Patient Education  2024 Elsevier Inc.    If you have been instructed to have an in-person evaluation today at a local Urgent Care facility, please use the link below. It will take you to a list of all of our available [facility], including address, phone number and hours of operation. Please do not delay care.  [facility]  If you or a family member do not have a primary care provider, use the link below to schedule a visit and establish care. When you choose a [facility] primary care physician or advanced practice provider, you gain a long-term partner in health. Find a Primary Care Provider  Learn more about [facility]'s in-office and virtual care options:  - Get Care Now

## 2023-07-10 ENCOUNTER — Telehealth: Payer: Self-pay | Admitting: Physician Assistant

## 2023-07-10 DIAGNOSIS — R6889 Other general symptoms and signs: Secondary | ICD-10-CM

## 2023-07-10 MED ORDER — ONDANSETRON 4 MG PO TBDP: 4.0000 mg | ORAL_TABLET | Freq: Three times a day (TID) | ORAL | 0 refills | Status: AC | PRN

## 2023-07-10 MED ORDER — OSELTAMIVIR PHOSPHATE 75 MG PO CAPS: 75.0000 mg | ORAL_CAPSULE | Freq: Two times a day (BID) | ORAL | 0 refills | Status: AC

## 2023-07-10 NOTE — Progress Notes (Signed)
E visit for Flu like symptoms   We are sorry that you are not feeling well.  Here is how we plan to help! Based on what you have shared with me it looks like you may have a respiratory virus that may be influenza.  Influenza or "the flu" is   an infection caused by a respiratory virus. The flu virus is highly contagious and persons who did not receive their yearly flu vaccination may "catch" the flu from close contact.  We have anti-viral medications to treat the viruses that cause this infection. They are not a "cure" and only shorten the course of the infection. These prescriptions are most effective when they are given within the first 2 days of "flu" symptoms. Antiviral medication are indicated if you have a high risk of complications from the flu. You should  also consider an antiviral medication if you are in close contact with someone who is at risk. These medications can help patients avoid complications from the flu  but have side effects that you should know. Possible side effects from Tamiflu or oseltamivir include nausea, vomiting, diarrhea, dizziness, headaches, eye redness, sleep problems or other respiratory symptoms. You should not take Tamiflu if you have an allergy to oseltamivir or any to the ingredients in Tamiflu.  Based upon your symptoms and potential risk factors I have prescribed Oseltamivir (Tamiflu).  It has been sent to your designated pharmacy.  You will take one 75 mg capsule orally twice a day for the next 5 days. I have also sent in Zofran to help with nausea.   I recommend that you follow the flu symptoms recommendation that I have listed below.  ANYONE WHO HAS FLU SYMPTOMS SHOULD: Stay home. The flu is highly contagious and going out or to work exposes others! Be sure to drink plenty of fluids. Water is fine as well as fruit juices, sodas and electrolyte beverages. You may want to stay away from caffeine or alcohol. If you are nauseated, try taking small sips of  liquids. How do you know if you are getting enough fluid? Your urine should be a pale yellow or almost colorless. Get rest. Taking a steamy shower or using a humidifier may help nasal congestion and ease sore throat pain. Using a saline nasal spray works much the same way. Cough drops, hard candies and sore throat lozenges may ease your cough. Line up a caregiver. Have someone check on you regularly.   GET HELP RIGHT AWAY IF: You cannot keep down liquids or your medications. You become short of breath Your fell like you are going to pass out or loose consciousness. Your symptoms persist after you have completed your treatment plan MAKE SURE YOU  Understand these instructions. Will watch your condition. Will get help right away if you are not doing well or get worse.  Your e-visit answers were reviewed by a board certified advanced clinical practitioner to complete your personal care plan.  Depending on the condition, your plan could have included both over the counter or prescription medications.  If there is a problem please reply  once you have received a response from your provider.  Your safety is important to [place].  If you have drug allergies check your prescription carefully.    You can use MyChart to ask questions about today's visit, request a non-urgent call back, or ask for a work or school excuse for 24 hours related to this e-Visit. If it has been greater than 24 hours you  will need to follow up with your provider, or enter a new e-Visit to address those concerns.  You will get an e-mail in the next two days asking about your experience.  I hope that your e-visit has been valuable and will speed your recovery. Thank you for using e-visits.

## 2023-07-10 NOTE — Progress Notes (Signed)
I have spent 5 minutes in review of e-visit questionnaire, review and updating patient chart, medical decision making and response to patient.   [name], PA-C

## 2023-07-10 NOTE — Progress Notes (Signed)
Message sent to patient requesting further input regarding current symptoms. Awaiting patient response.

## 2023-08-16 ENCOUNTER — Encounter: Payer: Self-pay | Admitting: Nurse Practitioner

## 2023-08-16 ENCOUNTER — Telehealth: Payer: Self-pay | Admitting: Nurse Practitioner

## 2023-08-16 DIAGNOSIS — L301 Dyshidrosis [pompholyx]: Secondary | ICD-10-CM

## 2023-08-16 DIAGNOSIS — L308 Other specified dermatitis: Secondary | ICD-10-CM

## 2023-08-16 MED ORDER — BETAMETHASONE VALERATE 0.1 % EX OINT: 1.0000 | TOPICAL_OINTMENT | Freq: Two times a day (BID) | CUTANEOUS | 0 refills | Status: AC

## 2023-08-16 NOTE — Progress Notes (Signed)
 I have spent 5 minutes in review of e-visit questionnaire, review and updating patient chart, medical decision making and response to patient.   Claiborne Rigg, NP

## 2023-08-16 NOTE — Progress Notes (Signed)
 E-Visit for Eczema  We are sorry that you are not feeling well. Here is how we plan to help! Based on what you shared with me it looks like you have eczema (atopic dermatitis).  Although the cause of eczema is not completely understood, genetics appear to play a strong role, and people with a family history of eczema are at increased risk of developing the condition. In most people with eczema, there is a genetic abnormality in the outermost layer of the skin, called the epidermis   Most people with eczema develop their first symptoms as children, before the age of 93. Intense itching of the skin, patches of redness, small bumps, and skin flaking are common. Scratching can further inflame the skin and worsen the itching. The itchiness may be more noticeable at nighttime.  Eczema commonly affects the back of the neck, the elbow creases, and the backs of the knees. Other affected areas may include the face, wrists, and forearms. The skin may become thickened and darkened, or even scarred, from repeated scratching. Eliminating factors that aggravate your eczema symptoms can help to control the symptoms. Possible triggers may include: ? Cold or dry environments ? Sweating ? Emotional stress or anxiety ? Rapid temperature changes ? Exposure to certain chemicals or cleaning solutions, including soaps and detergents, perfumes and cosmetics, wool or synthetic fibers, dust, sand, and cigarette smoke Keeping your skin hydrated Emollients -- Emollients are creams and ointments that moisturize the skin and prevent it from drying out. The best emollients for people with eczema are thick creams (such as Eucerin, Cetaphil, and Nutraderm) or ointments (such as petroleum jelly, Aquaphor, and Vaseline), which contain little to no water. Emollients are most effective when applied immediately after bathing. Emollients can be applied twice a day or more often if needed. Lotions contain more water than creams and  ointments and are less effective for moisturizing the skin. Bathing -- It is not clear if showers or baths are better for keeping the skin hydrated. Lukewarm baths or showers can hydrate and cool the skin, temporarily relieving itching from eczema. An unscented, mild soap or non-soap cleanser (such as Cetaphil) should be used sparingly. Apply an emollient immediately after bathing or showering to prevent your skin from drying out as a result of water evaporation. Emollient bath additives (products you add to the bath water) have not been found to help relieve symptoms. Hot or long baths (more than 10 to 15 minutes) and showers should be avoided since they can dry out the skin.  Based on what you shared with me you may have eczema. I have prescribed the following below and recommend you establish care at the local health department if you do not have insurance so that you can be seen in person and have a physical exam. If you do have insurance I recommend you follow up with your PCP  Betamethasone ointment (or cream).  Apply to effected areas twice per day  I recommend dilute bleach baths for people with eczema. These baths help to decrease the number of bacteria on the skin that can cause infections or worsen symptoms. To prepare a bleach bath, one-fourth to one-half cup of bleach is placed in a full bathtub (about 40 gallons) of water. Bleach baths are usually taken for 5 to 10 minutes twice per week and should be followed by application of an emollient (listed above). I recommend you take Benadryl 25mg  - 50mg  every 4 hours to control the symptoms (including itching) but if  they last over 24 hours it is best that you see an office based provider for follow up.  HOME CARE: Take lukewarm showers or baths Apply creams and ointments to prevent the skin from drying (Eucerin, Cetaphil, Nutraderm, petroleum jelly, Aquaphor or Vaseline) - these products contain less water than other lotions and are more  effective for moisturizing the skin Limit exposure to cold or dry environments, sweating, emotional stress and anxiety, rapid temperature changes and exposure to chemicals and cleaning products, soaps and detergents, perfumes, cosmetics, wool and synthetic fibers, dust, sand and cigarette- factors which can aggravate eczema symptoms.  Use a hydrocortisone cream once or twice a day Take an antihistamine like Benadryl for widespread rashes that itch.  The adult dosage of Benadryl is 25-50 mg by mouth 4 times daily. Caution: This type of medication may cause sleepiness.  Do not drink alcohol, drive, or operate dangerous machinery while taking antihistamines.  Do not take these medications if you have prostate enlargement.  Read the package instructions thoroughly on all medications that you take.  GET HELP RIGHT AWAY IF: Symptoms that don't go away after treatment. Severe itching that persists. You develop a fever. Your skin begins to drain. You have a sore throat. You become short of breath.  MAKE SURE YOU   Understand these instructions. Will watch your condition. Will get help right away if you are not doing well or get worse.    Thank you for choosing an e-visit.  Your e-visit answers were reviewed by a board certified advanced clinical practitioner to complete your personal care plan. Depending upon the condition, your plan could have included both over the counter or prescription medications.  Please review your pharmacy choice. Make sure the pharmacy is open so you can pick up prescription now. If there is a problem, you may contact your provider through [employer] and have the prescription routed to another pharmacy.  Your safety is important to [place]. If you have drug allergies check your prescription carefully.   For the next 24 hours you can use MyChart to ask questions about today's visit, request a non-urgent call back, or ask for a work or school excuse. You will get an  email in the next two days asking about your experience. I hope that your e-visit has been valuable and will speed your recovery.

## 2023-08-16 NOTE — Progress Notes (Signed)
 Evisit previously completed earlier today for same concern

## 2024-04-19 ENCOUNTER — Other Ambulatory Visit (HOSPITAL_COMMUNITY)
Admission: RE | Admit: 2024-04-19 | Discharge: 2024-04-19 | Disposition: A | Discharge status: Home / Self Care | Source: Ambulatory Visit | Attending: Obstetrics and Gynecology | Admitting: Obstetrics and Gynecology

## 2024-04-19 ENCOUNTER — Ambulatory Visit (INDEPENDENT_AMBULATORY_CARE_PROVIDER_SITE_OTHER): Admitting: Obstetrics and Gynecology

## 2024-04-19 VITALS — BP 120/70 | HR 76 | Ht 65.0 in | Wt 126.0 lb

## 2024-04-19 DIAGNOSIS — Z01419 Encounter for gynecological examination (general) (routine) without abnormal findings: Secondary | ICD-10-CM | POA: Diagnosis not present

## 2024-04-19 DIAGNOSIS — Z23 Encounter for immunization: Secondary | ICD-10-CM

## 2024-04-19 DIAGNOSIS — Z113 Encounter for screening for infections with a predominantly sexual mode of transmission: Secondary | ICD-10-CM | POA: Insufficient documentation

## 2024-04-19 DIAGNOSIS — Z124 Encounter for screening for malignant neoplasm of cervix: Secondary | ICD-10-CM | POA: Diagnosis present

## 2024-04-19 DIAGNOSIS — Z975 Presence of (intrauterine) contraceptive device: Secondary | ICD-10-CM

## 2024-04-19 NOTE — Progress Notes (Signed)
 ANNUAL GYNECOLOGY VISIT Chief Complaint  Patient presents with   Gynecologic Exam    Annual with IUD exchange      Subjective:  Diane Ramirez is a 33 y.o. [id number] who presents for annual.  Had postplacental Liletta  IUD inserted 05/2019. Unsure if she is due for new one. No concerns  Gyn History: Patient's last menstrual period was 04/15/2024 (approximate). Sexually active: yes/no: Yes Contraception: IUD Last pap:  Lab Results  Component Value Date   DIAGPAP (A) 01/20/2019    LOW GRADE SQUAMOUS INTRAEPITHELIAL LESION: CIN-1/ HPV (LSIL).   DIAGPAP (A) 01/20/2019    THERE ARE A FEW CELLS SUGGESTIVE OF A HIGHER GRADE LESION. CLINICAL CORRELATION IS RECOMMENDED.   DIAGPAP SHIFT IN [place] SUGGESTIVE OF BACTERIAL VAGINOSIS. (A) 01/20/2019   History of abnormal pap: Yes: see above, no pap since Periods: light periods with IUD    The pregnancy intention screening data noted above was reviewed. Potential methods of contraception were discussed. The patient elected to proceed with No data recorded.       04/19/2024    3:14 PM 05/26/2019    8:33 AM 01/20/2019   11:21 AM 11/25/2017    2:08 PM 10/24/2017   11:32 AM  Depression screen PHQ 2/9  Decreased Interest 1 0 0 0 0  Down, Depressed, Hopeless 0 0 0 0 0  PHQ - 2 Score 1 0 0 0 0  Altered sleeping 2 0 0 0 0  Tired, decreased energy 2 0 0 0 0  Change in appetite 3 0 0 0 0  Feeling bad or failure about yourself  0 0 0 0 0  Trouble concentrating 0 0 0 0 0  Moving slowly or fidgety/restless 0 0 0 0 0  Suicidal thoughts 0 0 0 0 0  PHQ-9 Score 8 0  0  0  0   Difficult doing work/chores  Not difficult at all Not difficult at all       Data saved with a previous flowsheet row definition        04/19/2024    3:14 PM 05/26/2019    8:33 AM 01/20/2019   11:21 AM 11/25/2017    2:09 PM  GAD 7 : Generalized Anxiety Score  Nervous, Anxious, on Edge 1 0 0 0  Control/stop worrying 2 0 0 0  Worry too much - different things 2 0 0 0   Trouble relaxing 0 0 0 0  Restless 0 0 0 0  Easily annoyed or irritable 1 0 0 0  Afraid - awful might happen 0 0 0 0  Total GAD 7 Score 6 0 0 0  Anxiety Difficulty  Not difficult at all Not difficult at all       OB History     Gravida  2   Para  2   Term  2   Preterm  0   AB  0   Living  2      SAB  0   IAB  0   Ectopic  0   Multiple  0   Live Births  2           Past Medical History:  Diagnosis Date   GBS bacteriuria    Hypertension    Pregnancy induced hypertension     Past Surgical History:  Procedure Laterality Date   NO PAST SURGERIES      Social History   Socioeconomic History   Marital status: Single    Spouse name:  Not on file   Number of children: 1   Years of education: Not on file   Highest education level: High school graduate  Occupational History   Not on file  Tobacco Use   Smoking status: Former    Current packs/day: 0.00    Average packs/day: 0.3 packs/day for 1 year (0.3 ttl pk-yrs)    Types: Cigars, Cigarettes    Start date: 08/30/2016    Quit date: 08/30/2017    Years since quitting: 6.6   Smokeless tobacco: Never  Vaping Use   Vaping status: Never Used  Substance and Sexual Activity   Alcohol use: Not Currently   Drug use: Not Currently    Types: Marijuana    Comment: last smoked November   Sexual activity: Yes    Birth control/protection: Injection    Comment: Depo-weight lose; last injection July 2019  Other Topics Concern   Not on file  Social History Narrative   Not on file   Social Drivers of Health   Financial Resource Strain: Low Risk  (01/19/2019)   Overall Financial Resource Strain (CARDIA)    Difficulty of Paying Living Expenses: Not hard at all  Food Insecurity: No Food Insecurity (01/19/2019)   Hunger Vital Sign    Worried About Running Out of Food in the Last Year: Never true    Ran Out of Food in the Last Year: Never true  Transportation Needs: No Transportation Needs (01/19/2019)    PRAPARE - Administrator, Civil Service (Medical): No    Lack of Transportation (Non-Medical): No  Physical Activity: Inactive (01/19/2019)   Exercise Vital Sign    Days of Exercise per Week: 0 days    Minutes of Exercise per Session: 0 min  Stress: Stress Concern Present (01/19/2019)   [employer] of Occupational Health - Occupational Stress Questionnaire    Feeling of Stress : To some extent  Social Connections: Moderately Isolated (01/19/2019)   Social Connection and Isolation Panel    Frequency of Communication with Friends and Family: More than three times a week    Frequency of Social Gatherings with Friends and Family: More than three times a week    Attends Religious Services: Never    Database Administrator or Organizations: No    Attends Engineer, Structural: Never    Marital Status: Living with partner    Family History  Problem Relation Age of Onset   Cancer Maternal Grandmother    Cancer Paternal Grandmother     Current Outpatient Medications on File Prior to Visit  Medication Sig Dispense Refill   betamethasone  valerate ointment (VALISONE ) 0.1 % Apply 1 Application topically 2 (two) times daily. 30 g 0   ondansetron  (ZOFRAN -ODT) 4 MG disintegrating tablet Take 1 tablet (4 mg total) by mouth every 8 (eight) hours as needed for nausea or vomiting. 20 tablet 0   oseltamivir  (TAMIFLU ) 75 MG capsule Take 1 capsule (75 mg total) by mouth 2 (two) times daily. 10 capsule 0   Prenatal Vit-Fe Fumarate-FA (MULTIVITAMIN-PRENATAL) 27-0.8 MG TABS tablet Take 1 tablet by mouth daily at 12 noon.     triamcinolone  cream (KENALOG ) 0.1 % Apply 1 Application topically 2 (two) times daily. 30 g 0   No current facility-administered medications on file prior to visit.    No Known Allergies   Objective:   Vitals:   04/19/24 1512  BP: 120/70  Pulse: 76  Weight: 126 lb (57.2 kg)  Height: 5' 5 (1.651 m)  Physical Examination:   General appearance - well  appearing, and in no distress  Mental status - alert, oriented to person, place, and time  Psych:  normal mood and affect  Skin - warm and dry, normal color, no suspicious lesions noted  Breasts - breasts appear normal, no suspicious masses, no skin or nipple changes or axillary nodes  Abdomen - soft, nontender, nondistended, no masses or organomegaly  Pelvic -  VULVA: normal appearing vulva with no masses, tenderness or lesions   VAGINA: normal appearing vagina with normal color and discharge, no lesions   CERVIX: normal appearing cervix without discharge or lesions, no CMT, IUD strings seen  Thin prep pap is done with HR HPV cotesting  UTERUS: uterus is felt to be normal size, shape, consistency and nontender   ADNEXA: No adnexal masses or tenderness noted.  Extremities:  No swelling or varicosities noted  Chaperone present for exam  Assessment and Plan:  1. Well woman exam with routine gynecological exam (Primary) Pap/HPV Normal clinical breast exam, reviewed self breast exams, mammograms to start at age 24 STI screening IUD in place  2. Cervical cancer screening - Cytology - PAP  3. Routine screening for STI (sexually transmitted infection) - Cervicovaginal ancillary only( ) - RPR+HBsAg+HCVAb+...  4. IUD (intrauterine device) in place Reviewed IUD effective as contraceptive for up to 8 years, patient happy to keep in place Due for exchange 2028  5. Need for influenza vaccination - Flu vaccine trivalent PF, 6mos and older(Flulaval,Afluria,Fluarix,Fluzone)   Rollo ONEIDA Bring, MD, FACOG Obstetrician & Gynecologist, [facility] for [facility], [facility]

## 2024-04-19 NOTE — Progress Notes (Signed)
 Patient presents for Annual.  LMP: No LMP recorded.  Last pap: 01/28/2019 Contraception: IUD: placed after delivery 05/2019 Mammogram: Not yet indicated STD Screening: Accepts Flu Vaccine : Accepts  CC: Annual and IUD replacement. Not sure which IUD she has it was placed in 2020   Fun Fact: 2 children and doesn't want to become pregnant

## 2024-04-21 ENCOUNTER — Ambulatory Visit: Payer: Self-pay | Admitting: Obstetrics and Gynecology

## 2024-04-21 LAB — CERVICOVAGINAL ANCILLARY ONLY
Chlamydia: NEGATIVE
Comment: NEGATIVE
Comment: NEGATIVE
Comment: NORMAL
Neisseria Gonorrhea: NEGATIVE
Trichomonas: NEGATIVE

## 2024-04-21 LAB — CYTOLOGY - PAP
Comment: NEGATIVE
Diagnosis: NEGATIVE
High risk HPV: NEGATIVE

## 2024-07-16 ENCOUNTER — Ambulatory Visit: Admitting: Family Medicine

## 2024-07-16 NOTE — Progress Notes (Incomplete)
 "   New Patient Office Visit   Subjective     Patient ID: Diane Ramirez, female   DOB: May 14, 1991  Age: 34 y.o. MRN: [id number]   CC:  No chief complaint on file.     HPI [name] presents to establish care.       Show/hide medication list[1] Past Medical History:  Diagnosis Date   GBS bacteriuria    Hypertension    Pregnancy induced hypertension     Past Surgical History:  Procedure Laterality Date   NO PAST SURGERIES       Family History  Problem Relation Age of Onset   Cancer Maternal Grandmother    Cancer Paternal Grandmother     Social History   Socioeconomic History   Marital status: Single    Spouse name: Not on file   Number of children: 1   Years of education: Not on file   Highest education level: High school graduate  Occupational History   Not on file  Tobacco Use   Smoking status: Former    Current packs/day: 0.00    Average packs/day: 0.3 packs/day for 1 year (0.3 ttl pk-yrs)    Types: Cigars, Cigarettes    Start date: 08/30/2016    Quit date: 08/30/2017    Years since quitting: 6.8   Smokeless tobacco: Never  Vaping Use   Vaping status: Never Used  Substance and Sexual Activity   Alcohol use: Not Currently   Drug use: Not Currently    Types: Marijuana    Comment: last smoked November   Sexual activity: Yes    Birth control/protection: Injection    Comment: Depo-weight lose; last injection July 2019  Other Topics Concern   Not on file  Social History Narrative   Not on file   Social Drivers of Health   Tobacco Use: Not on file  Financial Resource Strain: Not on file  Food Insecurity: Not on file  Transportation Needs: Not on file  Physical Activity: Not on file  Stress: Not on file  Social Connections: Not on file  Depression (PHQ2-9): Medium Risk (04/19/2024)   Depression (PHQ2-9)    PHQ-2 Score: 8  Alcohol Screen: Not on file  Housing: Not on file  Utilities: Not on file  Health Literacy: Not on file        ROS All review of systems negative except what is listed in the HPI    Objective     There were no vitals taken for this visit.  Physical Exam     Assessment & Plan:     Problem List Items Addressed This Visit   None            No follow-ups on file.  Waddell KATHEE Mon, NP  I,Emily Lagle,acting as a scribe for Waddell KATHEE Mon, NP.,have documented all relevant documentation on the behalf of Waddell KATHEE Mon, NP.  I, Waddell KATHEE Mon, NP, have reviewed all documentation for this visit. The documentation on 07/16/2024 for the exam, diagnosis, procedures, and orders are all accurate and complete.     [1]  Outpatient Medications Prior to Visit  Medication Sig   betamethasone  valerate ointment (VALISONE ) 0.1 % Apply 1 Application topically 2 (two) times daily.   ondansetron  (ZOFRAN -ODT) 4 MG disintegrating tablet Take 1 tablet (4 mg total) by mouth every 8 (eight) hours as needed for nausea or vomiting.   oseltamivir  (TAMIFLU ) 75 MG capsule Take 1 capsule (75 mg total) by mouth 2 (two) times daily.  Prenatal Vit-Fe Fumarate-FA (MULTIVITAMIN-PRENATAL) 27-0.8 MG TABS tablet Take 1 tablet by mouth daily at 12 noon.   triamcinolone  cream (KENALOG ) 0.1 % Apply 1 Application topically 2 (two) times daily.   No facility-administered medications prior to visit.   "

## 2024-08-31 ENCOUNTER — Ambulatory Visit: Admitting: Family Medicine
